# Patient Record
Sex: Female | Born: 1975 | Race: White | Hispanic: No | Marital: Married | Smoking: Never smoker
Health system: Southern US, Community
[De-identification: ages and names within clinical notes are randomized; demographics above are authoritative.]

## PROBLEM LIST (undated history)

## (undated) DIAGNOSIS — N939 Abnormal uterine and vaginal bleeding, unspecified: Secondary | ICD-10-CM

## (undated) DIAGNOSIS — E559 Vitamin D deficiency, unspecified: Secondary | ICD-10-CM

## (undated) DIAGNOSIS — Z8632 Personal history of gestational diabetes: Secondary | ICD-10-CM

## (undated) DIAGNOSIS — G43909 Migraine, unspecified, not intractable, without status migrainosus: Secondary | ICD-10-CM

## (undated) DIAGNOSIS — O24419 Gestational diabetes mellitus in pregnancy, unspecified control: Secondary | ICD-10-CM

## (undated) DIAGNOSIS — Z973 Presence of spectacles and contact lenses: Secondary | ICD-10-CM

## (undated) DIAGNOSIS — K219 Gastro-esophageal reflux disease without esophagitis: Secondary | ICD-10-CM

## (undated) DIAGNOSIS — E039 Hypothyroidism, unspecified: Secondary | ICD-10-CM

## (undated) HISTORY — PX: PILONIDAL CYST EXCISION: SHX744

## (undated) HISTORY — PX: OTHER SURGICAL HISTORY: SHX169

## (undated) HISTORY — DX: Gestational diabetes mellitus in pregnancy, unspecified control: O24.419

## (undated) HISTORY — PX: WISDOM TOOTH EXTRACTION: SHX21

---

## 1997-12-11 ENCOUNTER — Other Ambulatory Visit: Admission: RE | Admit: 1997-12-11 | Discharge: 1997-12-11 | Payer: Self-pay | Admitting: Obstetrics and Gynecology

## 2007-05-28 ENCOUNTER — Emergency Department (HOSPITAL_COMMUNITY): Admission: EM | Admit: 2007-05-28 | Discharge: 2007-05-28 | Payer: Self-pay | Admitting: Family Medicine

## 2010-09-29 ENCOUNTER — Inpatient Hospital Stay (HOSPITAL_COMMUNITY)
Admission: AD | Admit: 2010-09-29 | Discharge: 2010-09-29 | Disposition: A | Discharge status: Home / Self Care | Payer: Self-pay | Source: Ambulatory Visit | Attending: Obstetrics and Gynecology | Admitting: Obstetrics and Gynecology

## 2010-09-29 DIAGNOSIS — O209 Hemorrhage in early pregnancy, unspecified: Secondary | ICD-10-CM | POA: Insufficient documentation

## 2010-10-04 LAB — HIV ANTIBODY (ROUTINE TESTING W REFLEX): HIV: NONREACTIVE

## 2010-10-04 LAB — GC/CHLAMYDIA PROBE AMP, GENITAL
Chlamydia: NEGATIVE
Gonorrhea: NEGATIVE

## 2010-10-04 LAB — ANTIBODY SCREEN: Antibody Screen: NEGATIVE

## 2010-10-04 LAB — HEPATITIS B SURFACE ANTIGEN: Hepatitis B Surface Ag: NEGATIVE

## 2010-10-04 LAB — ABO/RH: RH Type: NEGATIVE

## 2010-10-15 ENCOUNTER — Other Ambulatory Visit (HOSPITAL_COMMUNITY): Payer: Self-pay | Admitting: Obstetrics and Gynecology

## 2010-10-15 DIAGNOSIS — O209 Hemorrhage in early pregnancy, unspecified: Secondary | ICD-10-CM

## 2010-10-20 ENCOUNTER — Ambulatory Visit (HOSPITAL_COMMUNITY): Payer: Self-pay

## 2011-01-20 ENCOUNTER — Other Ambulatory Visit (HOSPITAL_COMMUNITY): Payer: Self-pay | Admitting: Obstetrics and Gynecology

## 2011-01-20 DIAGNOSIS — Z3689 Encounter for other specified antenatal screening: Secondary | ICD-10-CM

## 2011-02-08 ENCOUNTER — Ambulatory Visit (HOSPITAL_COMMUNITY): Payer: BC Managed Care – PPO

## 2011-03-29 NOTE — L&D Delivery Note (Signed)
Delivery Note Pt progressed rapidly to complete dilation and pushed great.  At 7:13 AM a healthy female was delivered via Vaginal, Spontaneous Delivery (Presentation: Left Occiput Anterior).  APGAR: 9, 9; weight 5 lb 13 oz (2637 g).   Placenta status: Intact, Spontaneous.  Cord: 3 vessels with the following complications: None.  Anesthesia: Epidural  Episiotomy: None Lacerations: 2nd degree Suture Repair: 3.0 vicryl rapide Est. Blood Loss (mL): 450 cc  Mom to postpartum.  Baby to nursery-stable. Per pt, fainted in past with narcotics--not an allergy.  Advised if needs percocet to take only one and on a full stomach.  Kristy Larsen 04/13/2011, 7:40 AM

## 2011-04-12 ENCOUNTER — Encounter (HOSPITAL_COMMUNITY): Payer: Self-pay

## 2011-04-12 ENCOUNTER — Inpatient Hospital Stay (HOSPITAL_COMMUNITY)
Admission: AD | Admit: 2011-04-12 | Discharge: 2011-04-15 | DRG: 372 | Disposition: A | Discharge status: Home / Self Care | Payer: BC Managed Care – PPO | Source: Ambulatory Visit | Attending: Obstetrics and Gynecology | Admitting: Obstetrics and Gynecology

## 2011-04-12 HISTORY — DX: Hypothyroidism, unspecified: E03.9

## 2011-04-12 HISTORY — DX: Migraine, unspecified, not intractable, without status migrainosus: G43.909

## 2011-04-12 HISTORY — DX: Gastro-esophageal reflux disease without esophagitis: K21.9

## 2011-04-12 LAB — CBC
Hemoglobin: 12.3 g/dL (ref 12.0–15.0)
MCHC: 34.9 g/dL (ref 30.0–36.0)
RBC: 3.98 MIL/uL (ref 3.87–5.11)

## 2011-04-12 MED ORDER — PENICILLIN G POTASSIUM 5000000 UNITS IJ SOLR
2.5000 10*6.[IU] | INTRAVENOUS | Status: DC
  Administered 2011-04-13: 2.5 10*6.[IU] via INTRAVENOUS
  Filled 2011-04-12 (×4): qty 2.5

## 2011-04-12 MED ORDER — LACTATED RINGERS IV SOLN
INTRAVENOUS | Status: DC
  Administered 2011-04-12 – 2011-04-13 (×2): via INTRAVENOUS

## 2011-04-12 MED ORDER — DIPHENHYDRAMINE HCL 50 MG/ML IJ SOLN: 12.5000 mg | INTRAMUSCULAR | Status: DC | PRN

## 2011-04-12 MED ORDER — FLEET ENEMA 7-19 GM/118ML RE ENEM: 1.0000 | ENEMA | RECTAL | Status: DC | PRN

## 2011-04-12 MED ORDER — LORATADINE 10 MG PO TABS
10.0000 mg | ORAL_TABLET | Freq: Every day | ORAL | Status: DC
  Filled 2011-04-12: qty 1

## 2011-04-12 MED ORDER — LIDOCAINE HCL (PF) 1 % IJ SOLN
30.0000 mL | INTRAMUSCULAR | Status: DC | PRN
  Administered 2011-04-13: 30 mL via SUBCUTANEOUS
  Filled 2011-04-12: qty 30

## 2011-04-12 MED ORDER — PHENYLEPHRINE 40 MCG/ML (10ML) SYRINGE FOR IV PUSH (FOR BLOOD PRESSURE SUPPORT): 80.0000 ug | PREFILLED_SYRINGE | INTRAVENOUS | Status: DC | PRN

## 2011-04-12 MED ORDER — OXYTOCIN BOLUS FROM INFUSION
500.0000 mL | Freq: Once | INTRAVENOUS | Status: DC
  Filled 2011-04-12: qty 500
  Filled 2011-04-12: qty 1000

## 2011-04-12 MED ORDER — PHENYLEPHRINE 40 MCG/ML (10ML) SYRINGE FOR IV PUSH (FOR BLOOD PRESSURE SUPPORT)
80.0000 ug | PREFILLED_SYRINGE | INTRAVENOUS | Status: DC | PRN
  Filled 2011-04-12: qty 5

## 2011-04-12 MED ORDER — PENICILLIN G POTASSIUM 5000000 UNITS IJ SOLR
5.0000 10*6.[IU] | Freq: Once | INTRAVENOUS | Status: AC
  Administered 2011-04-12: 5 10*6.[IU] via INTRAVENOUS
  Filled 2011-04-12: qty 5

## 2011-04-12 MED ORDER — LACTATED RINGERS IV SOLN: 500.0000 mL | Freq: Once | INTRAVENOUS | Status: DC

## 2011-04-12 MED ORDER — ACETAMINOPHEN 325 MG PO TABS: 650.0000 mg | ORAL_TABLET | ORAL | Status: DC | PRN

## 2011-04-12 MED ORDER — LEVOTHYROXINE SODIUM 125 MCG PO TABS
125.0000 ug | ORAL_TABLET | Freq: Every day | ORAL | Status: DC
  Administered 2011-04-13: 125 ug via ORAL
  Filled 2011-04-12: qty 1

## 2011-04-12 MED ORDER — OXYTOCIN 20 UNITS IN LACTATED RINGERS INFUSION - SIMPLE
125.0000 mL/h | Freq: Once | INTRAVENOUS | Status: AC
  Administered 2011-04-13: 999 mL/h via INTRAVENOUS

## 2011-04-12 MED ORDER — LACTATED RINGERS IV SOLN: 500.0000 mL | INTRAVENOUS | Status: DC | PRN

## 2011-04-12 MED ORDER — EPHEDRINE 5 MG/ML INJ: 10.0000 mg | INTRAVENOUS | Status: DC | PRN

## 2011-04-12 MED ORDER — CITRIC ACID-SODIUM CITRATE 334-500 MG/5ML PO SOLN: 30.0000 mL | ORAL | Status: DC | PRN

## 2011-04-12 MED ORDER — EPHEDRINE 5 MG/ML INJ
10.0000 mg | INTRAVENOUS | Status: DC | PRN
  Filled 2011-04-12: qty 4

## 2011-04-12 MED ORDER — FENTANYL 2.5 MCG/ML BUPIVACAINE 1/10 % EPIDURAL INFUSION (WH - ANES)
14.0000 mL/h | INTRAMUSCULAR | Status: DC
  Administered 2011-04-13 (×2): 14 mL/h via EPIDURAL
  Filled 2011-04-12 (×2): qty 60

## 2011-04-12 MED ORDER — ONDANSETRON HCL 4 MG/2ML IJ SOLN: 4.0000 mg | Freq: Four times a day (QID) | INTRAMUSCULAR | Status: DC | PRN

## 2011-04-12 NOTE — Progress Notes (Signed)
Notified of pt presenting for labor check. Notified of VE and ctx pattern.  Notified of bloody show and bulging bag.  Admit orders received.

## 2011-04-12 NOTE — Progress Notes (Signed)
Pt may go to room 166. 

## 2011-04-12 NOTE — Progress Notes (Signed)
Pt presents to mau for labor check.  States + fetal movement.  Not as much as usual but does have movement.

## 2011-04-12 NOTE — H&P (Signed)
Kristy Larsen is a 36 y.o. female  G1P0 at 36+ weeks (EDD 05/06/11 by 5 week [place] and known date of conception with IVF pregnancy)  presenting for regular contractions and cervical change to 4+ cm.  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  Also Rh negative, receiving rhogam at 28 weeks and pt with hypothyroidism on synthroid with levels checked throughout pregnancy.  Maternal Medical History:  Reason for admission: Reason for admission: contractions.  Contractions: Onset was 6-12 hours ago.   Frequency: regular.   Perceived severity is strong.    Fetal activity: Perceived fetal activity is normal.    Prenatal complications: Bleeding.     OB History    Grav Para Term Preterm Abortions TAB SAB Ect Mult Living   1              Past Medical History  Diagnosis Date  . Acid reflux   . Migraines   . Hypothyroid    Past Surgical History  Procedure Date  . Cyst   . Wisdom tooth extraction    Family History: family history is not on file. Social History:  reports that she has never smoked. She does not have any smokeless tobacco history on file. She reports that she does not drink alcohol or use illicit drugs.  ROS  Dilation: 4 Effacement (%): 80 Station: -1 (Bulging Bag. ) Exam by:: Humphrey Rolls, RN Blood pressure 143/86, pulse 86, temperature 98.3 F (36.8 C), resp. rate 18. Maternal Exam:  Uterine Assessment: Contraction strength is firm.  Contraction frequency is regular.   Abdomen: Patient reports no abdominal tenderness. Fetal presentation: vertex  Introitus: Normal vulva. Normal vagina.    Fetal Exam Fetal State Assessment: Category I - tracings are normal.     Physical Exam  Constitutional: She is oriented to person, place, and time. She appears well-developed and well-nourished.  Cardiovascular: Normal rate and regular rhythm.   Respiratory: Effort normal and breath sounds normal.  GI: Soft.  Genitourinary:       Cervix 80/4/-2   Neurological: She is alert and oriented to person, place, and time.  Psychiatric: She has a normal mood and affect. Her behavior is normal.    Prenatal labs: ABO, Rh: A/Negative/-- (07/09 0000) Antibody: Negative (07/09 0000) Rubella: Immune (07/09 0000) RPR: Nonreactive (07/09 0000)  HBsAg: Negative (07/09 0000)  HIV: Non-reactive (07/09 0000)  GBS:   unknown--result not back One hour glucola 113 Declined genetic screens  Assessment/Plan: Pt uncomfortable in labor.  Will admit and get epidural.  Will treat with PCN for unknown GBS and preterm status.   Oliver Pila 04/12/2011, 11:42 PM

## 2011-04-13 ENCOUNTER — Encounter (HOSPITAL_COMMUNITY): Payer: Self-pay | Admitting: Anesthesiology

## 2011-04-13 ENCOUNTER — Inpatient Hospital Stay (HOSPITAL_COMMUNITY): Payer: BC Managed Care – PPO | Admitting: Anesthesiology

## 2011-04-13 ENCOUNTER — Encounter (HOSPITAL_COMMUNITY): Payer: Self-pay | Admitting: *Deleted

## 2011-04-13 LAB — RH IG WORKUP (INCLUDES ABO/RH)
ABO/RH(D): A NEG
Antibody Screen: POSITIVE
DAT, IgG: NEGATIVE
Fetal Screen: NEGATIVE
Gestational Age(Wks): 36.5
Unit division: 0

## 2011-04-13 LAB — RPR: RPR Ser Ql: NONREACTIVE

## 2011-04-13 LAB — ABO/RH: ABO/RH(D): A NEG

## 2011-04-13 MED ORDER — PRENATAL MULTIVITAMIN CH
1.0000 | ORAL_TABLET | Freq: Every day | ORAL | Status: DC
  Administered 2011-04-14: 1 via ORAL
  Filled 2011-04-13 (×3): qty 1

## 2011-04-13 MED ORDER — OXYCODONE-ACETAMINOPHEN 5-325 MG PO TABS
1.0000 | ORAL_TABLET | ORAL | Status: DC | PRN
  Administered 2011-04-13: 1 via ORAL
  Filled 2011-04-13: qty 1

## 2011-04-13 MED ORDER — ONDANSETRON HCL 4 MG/2ML IJ SOLN: 4.0000 mg | INTRAMUSCULAR | Status: DC | PRN

## 2011-04-13 MED ORDER — LIDOCAINE HCL 1.5 % IJ SOLN
INTRAMUSCULAR | Status: DC | PRN
  Administered 2011-04-13 (×2): 5 mL via EPIDURAL

## 2011-04-13 MED ORDER — DIBUCAINE 1 % RE OINT: 1.0000 "application " | TOPICAL_OINTMENT | RECTAL | Status: DC | PRN

## 2011-04-13 MED ORDER — IBUPROFEN 600 MG PO TABS
600.0000 mg | ORAL_TABLET | Freq: Four times a day (QID) | ORAL | Status: DC
  Administered 2011-04-13 – 2011-04-15 (×10): 600 mg via ORAL
  Filled 2011-04-13 (×11): qty 1

## 2011-04-13 MED ORDER — WITCH HAZEL-GLYCERIN EX PADS: 1.0000 "application " | MEDICATED_PAD | CUTANEOUS | Status: DC | PRN

## 2011-04-13 MED ORDER — TETANUS-DIPHTH-ACELL PERTUSSIS 5-2.5-18.5 LF-MCG/0.5 IM SUSP: 0.5000 mL | Freq: Once | INTRAMUSCULAR | Status: DC

## 2011-04-13 MED ORDER — DIPHENHYDRAMINE HCL 25 MG PO CAPS: 25.0000 mg | ORAL_CAPSULE | Freq: Four times a day (QID) | ORAL | Status: DC | PRN

## 2011-04-13 MED ORDER — BENZOCAINE-MENTHOL 20-0.5 % EX AERO
1.0000 "application " | INHALATION_SPRAY | CUTANEOUS | Status: DC | PRN
  Administered 2011-04-15: 1 via TOPICAL

## 2011-04-13 MED ORDER — LANOLIN HYDROUS EX OINT: TOPICAL_OINTMENT | CUTANEOUS | Status: DC | PRN

## 2011-04-13 MED ORDER — TERBUTALINE SULFATE 1 MG/ML IJ SOLN: 0.2500 mg | Freq: Once | INTRAMUSCULAR | Status: DC | PRN

## 2011-04-13 MED ORDER — SIMETHICONE 80 MG PO CHEW
80.0000 mg | CHEWABLE_TABLET | ORAL | Status: DC | PRN
  Administered 2011-04-13 – 2011-04-14 (×2): 80 mg via ORAL

## 2011-04-13 MED ORDER — BENZOCAINE-MENTHOL 20-0.5 % EX AERO
INHALATION_SPRAY | CUTANEOUS | Status: AC
  Filled 2011-04-13: qty 56

## 2011-04-13 MED ORDER — OXYTOCIN 20 UNITS IN LACTATED RINGERS INFUSION - SIMPLE
1.0000 m[IU]/min | INTRAVENOUS | Status: DC
  Administered 2011-04-13: 2 m[IU]/min via INTRAVENOUS

## 2011-04-13 MED ORDER — ZOLPIDEM TARTRATE 5 MG PO TABS: 5.0000 mg | ORAL_TABLET | Freq: Every evening | ORAL | Status: DC | PRN

## 2011-04-13 MED ORDER — ONDANSETRON HCL 4 MG PO TABS: 4.0000 mg | ORAL_TABLET | ORAL | Status: DC | PRN

## 2011-04-13 MED ORDER — SENNOSIDES-DOCUSATE SODIUM 8.6-50 MG PO TABS
2.0000 | ORAL_TABLET | Freq: Every day | ORAL | Status: DC
  Administered 2011-04-13 – 2011-04-14 (×2): 2 via ORAL

## 2011-04-13 NOTE — Progress Notes (Signed)
Pt admitted and received epidural and is comfortable.  Made good progress with SROM at 300am with cervix at 7 cm.  At 5am was still 7-8 cm.  Has received PCN x 2 doses for unknown GBS/preterm status.  Subjective: Comfortable with some pressure.  Objective: BP 107/70  Pulse 89  Temp(Src) 99.5 F (37.5 C) (Oral)  Resp 18  Ht 5\' 5"  (1.651 m)  Wt 84.823 kg (187 lb)  BMI 31.12 kg/m2  SpO2 88%      FHT:  FHR: 150 bpm, variability: moderate,  accelerations:  Present,  decelerations:  Absent UC:   regular, every 6-7 minutes SVE:  C/8/0 Labs: Lab Results  Component Value Date   WBC 16.7* 04/12/2011   HGB 12.3 04/12/2011   HCT 35.2* 04/12/2011   MCV 88.4 04/12/2011   PLT 166 04/12/2011    Assessment / Plan: Pt at 8cm, but progress has been slower over last 2 hours so IUPC placed and will augment with pitocin as needed. Oliver Pila 04/13/2011, 6:08 AM

## 2011-04-13 NOTE — Anesthesia Procedure Notes (Signed)
Epidural Patient location during procedure: OB Start time: 04/13/2011 12:23 AM  Staffing Anesthesiologist: Brayton Caves R Performed by: anesthesiologist   Preanesthetic Checklist Completed: patient identified, site marked, surgical consent, pre-op evaluation, timeout performed, IV checked, risks and benefits discussed and monitors and equipment checked  Epidural Patient position: sitting Prep: site prepped and draped and DuraPrep Patient monitoring: continuous pulse ox and blood pressure Approach: midline Injection technique: LOR air and LOR saline  Needle:  Needle type: Tuohy  Needle gauge: 17 G Needle length: 9 cm Needle insertion depth: 5 cm cm Catheter type: closed end flexible Catheter size: 19 Gauge Catheter at skin depth: 10 cm Test dose: negative  Assessment Events: blood not aspirated, injection not painful, no injection resistance, negative IV test and no paresthesia  Additional Notes Patient identified.  Risk benefits discussed including failed block, incomplete pain control, headache, nerve damage, paralysis, blood pressure changes, nausea, vomiting, reactions to medication both toxic or allergic, and postpartum back pain.  Patient expressed understanding and wished to proceed.  All questions were answered.  Sterile technique used throughout procedure and epidural site dressed with sterile barrier dressing. No paresthesia or other complications noted.The patient did not experience any signs of intravascular injection such as tinnitus or metallic taste in mouth nor signs of intrathecal spread such as rapid motor block. Please see nursing notes for vital signs.

## 2011-04-14 LAB — CBC
HCT: 27.5 % — ABNORMAL LOW (ref 36.0–46.0)
Hemoglobin: 9.2 g/dL — ABNORMAL LOW (ref 12.0–15.0)
MCH: 30.4 pg (ref 26.0–34.0)
MCHC: 33.5 g/dL (ref 30.0–36.0)

## 2011-04-14 MED ORDER — RHO D IMMUNE GLOBULIN 1500 UNIT/2ML IJ SOLN
300.0000 ug | Freq: Once | INTRAMUSCULAR | Status: AC
  Administered 2011-04-15: 300 ug via INTRAMUSCULAR
  Filled 2011-04-14: qty 2

## 2011-04-14 NOTE — Anesthesia Postprocedure Evaluation (Signed)
  Anesthesia Post-op Note  Patient: Kristy Larsen  Procedure(s) Performed: * No procedures listed *  Patient Location: PACU and Mother/Baby  Anesthesia Type: Epidural  Level of Consciousness: awake, alert  and oriented  Airway and Oxygen Therapy: Patient Spontanous Breathing  Post-op Assessment: Post-op Vital signs reviewed, Patient's Cardiovascular Status Stable, No headache, No backache, No residual numbness and No residual motor weakness  Post-op Vital Signs: Reviewed and stable  Complications: No apparent anesthesia complications

## 2011-04-14 NOTE — Anesthesia Preprocedure Evaluation (Signed)
Anesthesia Evaluation  Patient identified by MRN, date of birth, ID band Patient awake    Reviewed: Allergy & Precautions, H&P , Patient's Chart, lab work & pertinent test results  Airway Mallampati: II TM Distance: >3 FB Neck ROM: full    Dental No notable dental hx.    Pulmonary neg pulmonary ROS,  clear to auscultation  Pulmonary exam normal       Cardiovascular neg cardio ROS regular Normal    Neuro/Psych  Headaches, Negative Neurological ROS  Negative Psych ROS   GI/Hepatic negative GI ROS, Neg liver ROS, GERD-  ,  Endo/Other  Negative Endocrine ROS  Renal/GU negative Renal ROS     Musculoskeletal   Abdominal   Peds  Hematology negative hematology ROS (+)   Anesthesia Other Findings   Reproductive/Obstetrics (+) Pregnancy                           Anesthesia Physical Anesthesia Plan  ASA: II  Anesthesia Plan: Epidural   Post-op Pain Management:    Induction:   Airway Management Planned:   Additional Equipment:   Intra-op Plan:   Post-operative Plan:   Informed Consent: I have reviewed the patients History and Physical, chart, labs and discussed the procedure including the risks, benefits and alternatives for the proposed anesthesia with the patient or authorized representative who has indicated his/her understanding and acceptance.     Plan Discussed with:   Anesthesia Plan Comments:         Anesthesia Quick Evaluation

## 2011-04-14 NOTE — Progress Notes (Signed)
UR Chart review completed.  

## 2011-04-14 NOTE — Progress Notes (Signed)
Post Partum Day 1 Subjective: no complaints and tolerating PO  Objective: Blood pressure 126/82, pulse 76, temperature 98.3 F (36.8 C), temperature source Oral, resp. rate 20, height 5\' 5"  (1.651 m), weight 84.823 kg (187 lb), SpO2 88.00%, unknown if currently breastfeeding.  Physical Exam:  General: alert Lochia: appropriate Uterine Fundus: firm   Basename 04/14/11 0656 04/12/11 2325  HGB 9.2* 12.3  HCT 27.5* 35.2*    Assessment/Plan: Plan for discharge tomorrow   LOS: 2 days   Per Beagley W 04/14/2011, 9:01 AM

## 2011-04-15 MED ORDER — BENZOCAINE-MENTHOL 20-0.5 % EX AERO
INHALATION_SPRAY | CUTANEOUS | Status: AC
  Administered 2011-04-15: 1 via TOPICAL
  Filled 2011-04-15: qty 56

## 2011-04-15 MED ORDER — IBUPROFEN 600 MG PO TABS: 600.0000 mg | ORAL_TABLET | Freq: Four times a day (QID) | ORAL | Status: AC

## 2011-04-15 NOTE — Progress Notes (Signed)
Post Partum Day 2 Subjective: no complaints and tolerating PO  Objective: Blood pressure 124/88, pulse 73, temperature 98.5 F (36.9 C), temperature source Oral, resp. rate 20, height 5\' 5"  (1.651 m), weight 84.823 kg (187 lb), SpO2 88.00%, unknown if currently breastfeeding.  Physical Exam:  General: alert Lochia: appropriate Uterine Fundus: firm    Basename 04/14/11 0656 04/12/11 2325  HGB 9.2* 12.3  HCT 27.5* 35.2*    Assessment/Plan: Discharge home Motrin F/u 6 weeks   LOS: 3 days   Lauri Till W 04/15/2011, 8:34 AM

## 2011-04-15 NOTE — Discharge Summary (Signed)
Obstetric Discharge Summary Reason for Admission: onset of labor Prenatal Procedures: none Intrapartum Procedures: spontaneous vaginal delivery Postpartum Procedures: none Complications-Operative and Postpartum: second degree perineal laceration Hemoglobin  Date Value Range Status  04/14/2011 9.2* 12.0-15.0 (g/dL) Final     DELTA CHECK NOTED     REPEATED TO VERIFY     HCT  Date Value Range Status  04/14/2011 27.5* 36.0-46.0 (%) Final    Discharge Diagnoses: Preterm pregnancy at 36+weeks delivered  Discharge Information: Date: 04/15/2011 Activity: pelvic rest Diet: routine Medications: Ibuprofen Condition: improved Instructions: refer to practice specific booklet Discharge to: home   Newborn Data: Live born female  Birth Weight: 5 lb 13 oz (2637 g) APGAR: 9, 9  Home with mother.  Kristy Larsen 04/15/2011, 8:34 AM

## 2011-04-15 NOTE — Progress Notes (Signed)
Discharge instructions went over with patient. Baby to be baby patient.

## 2014-01-27 ENCOUNTER — Encounter (HOSPITAL_COMMUNITY): Payer: Self-pay | Admitting: *Deleted

## 2015-03-29 NOTE — L&D Delivery Note (Signed)
Procedure note  Patient was taken to the operating room where epidural anesthesia was administered and found to be adequate by Allis clamp test. FHTs confirmed in 140s.  She was prepped and draped in the normal sterile fashion in the dorsal supine position with a leftward tilt. An appropriate time out was performed. A Pfannenstiel skin incision was then made with the scalpel 2 finger breaths above the pubic symphysis and carried through to the underlying layer of fascia by sharp dissection and Bovie cautery. The fascia was nicked in the midline and the incision was extended laterally with Mayo scissors. The inferior aspect of the incision was grasped Coker clamps and dissected off the underlying rectus muscles. In a similar fashion the superior aspect was dissected off the rectus muscles. Rectus muscles were separated in the midline and the peritoneal cavity entered bluntly. The peritoneal incision was then extended both superiorly and inferiorly with careful attention to avoid both bowel and bladder. The Alexis self-retaining wound retractor was then placed within the incision and the lower uterine segment exposed. The bladder flap was developed with Metzenbaum scissors and pushed away from the lower uterine segment. The lower uterine segment was then incised in a transverse fashion and the cavity itself entered bluntly. The incision was extended bluntly. An anterior placenta was encountered; advancing past this, the infant's head was palpated and several loops of umbilical cord surrounding infants head were pushed out of the way gently thus infants head was then lifted and delivered from the incision without difficulty. The remainder of the infant delivered easily and the nose and mouth bulb suctioned with the cord clamped and cut after 1 minute delay. The infant was handed off to the waiting pediatricians. The placenta was then spontaneously expressed from the uterus and the uterus cleared of all clots and  debris with moist lap sponge. The uterine incision was then repaired in 1 l running locked layer using 0 chromic - a second layer was not indicated. The tubes and ovaries were inspected and the gutters cleared of all clots and debris. The uterine incision was inspected again and found to be hemostatic. All instruments and sponges as well as the Alexis retractor were then removed from the abdomen. The peritoneum was then reapproximated in a purse string fashion and the rectus muscle in a loose figure 8 with 2-0 Vicryl. The fascia was then closed with 0 Vicryl in a running fashion. Subcutaneous tissue was reapproximated with 2-0 plain in a running fashion. The skin was closed with a subcuticular stitch of 4-0 Vicryl on a Keith needle and then reinforced with a honeycomb and pressure dressing. At the conclusion of the procedure all instruments and sponge counts were correct. Patient was taken to the recovery room in good condition with her baby accompanying her skin to skin.

## 2015-05-20 ENCOUNTER — Ambulatory Visit: Payer: Managed Care, Other (non HMO)

## 2015-05-27 ENCOUNTER — Encounter: Payer: Managed Care, Other (non HMO) | Attending: Obstetrics and Gynecology

## 2015-05-27 VITALS — Ht 60.0 in | Wt 167.8 lb

## 2015-05-27 DIAGNOSIS — O24419 Gestational diabetes mellitus in pregnancy, unspecified control: Secondary | ICD-10-CM

## 2015-05-27 DIAGNOSIS — O24410 Gestational diabetes mellitus in pregnancy, diet controlled: Secondary | ICD-10-CM | POA: Diagnosis present

## 2015-06-02 NOTE — Progress Notes (Signed)
  Patient was seen on 05/27/15 for Gestational Diabetes self-management . The following learning objectives were met by the patient :   States the definition of Gestational Diabetes  States why dietary management is important in controlling blood glucose  Describes the effects of carbohydrates on blood glucose levels  Demonstrates ability to create a balanced meal plan  Demonstrates carbohydrate counting   States when to check blood glucose levels  Demonstrates proper blood glucose monitoring techniques  States the effect of stress and exercise on blood glucose levels  States the importance of limiting caffeine and abstaining from alcohol and smoking  Plan:  Aim for 2 Carb Choices per meal (30 grams) +/- 1 either way for breakfast Aim for 3 Carb Choices per meal (45 grams) +/- 1 either way from lunch and dinner Aim for 1-2 Carbs per snack Begin reading food labels for Total Carbohydrate and sugar grams of foods Consider  increasing your activity level by walking daily as tolerated Begin checking BG before breakfast and 2 hours after first bit of breakfast, lunch and dinner after  as directed by MD  Take medication  as directed by MD  Patient instructed to monitor glucose levels: FBS: 60 - <90 2 hour: <120  Patient received the following handouts:  Nutrition Diabetes and Pregnancy  Carbohydrate Counting List  Meal Planning worksheet  Patient will be seen for follow-up as needed.

## 2015-06-09 ENCOUNTER — Telehealth (HOSPITAL_COMMUNITY): Payer: Self-pay | Admitting: Lactation Services

## 2015-06-09 NOTE — Telephone Encounter (Signed)
Mom called with questions- to deliver in a couple of [place]. Used NS with first baby and may need to use again. Told her we have them available and will assist with latch while she is here. Taking Claritin for allergies- is this OK with breast feeding. L1 per Bobbye Mortonhomas Hale- encouraged to take only as needed. May have to have C/S- concerned about nursing after C/S- reviewed skin to skin after delivery and assist with latch. No further questions at present.

## 2015-06-29 DIAGNOSIS — Z3A35 35 weeks gestation of pregnancy: Secondary | ICD-10-CM | POA: Diagnosis not present

## 2015-06-29 DIAGNOSIS — O09213 Supervision of pregnancy with history of pre-term labor, third trimester: Secondary | ICD-10-CM | POA: Diagnosis not present

## 2015-06-29 DIAGNOSIS — O09523 Supervision of elderly multigravida, third trimester: Secondary | ICD-10-CM | POA: Diagnosis not present

## 2015-06-29 DIAGNOSIS — Z36 Encounter for antenatal screening of mother: Secondary | ICD-10-CM | POA: Diagnosis not present

## 2015-06-29 DIAGNOSIS — O24410 Gestational diabetes mellitus in pregnancy, diet controlled: Secondary | ICD-10-CM | POA: Diagnosis not present

## 2015-07-01 DIAGNOSIS — O09523 Supervision of elderly multigravida, third trimester: Secondary | ICD-10-CM | POA: Diagnosis not present

## 2015-07-01 DIAGNOSIS — O24410 Gestational diabetes mellitus in pregnancy, diet controlled: Secondary | ICD-10-CM | POA: Diagnosis not present

## 2015-07-01 DIAGNOSIS — R102 Pelvic and perineal pain: Secondary | ICD-10-CM | POA: Diagnosis not present

## 2015-07-01 DIAGNOSIS — Z3A35 35 weeks gestation of pregnancy: Secondary | ICD-10-CM | POA: Diagnosis not present

## 2015-07-01 DIAGNOSIS — O09213 Supervision of pregnancy with history of pre-term labor, third trimester: Secondary | ICD-10-CM | POA: Diagnosis not present

## 2015-07-03 ENCOUNTER — Encounter (HOSPITAL_COMMUNITY): Payer: Self-pay | Admitting: *Deleted

## 2015-07-03 ENCOUNTER — Inpatient Hospital Stay (HOSPITAL_COMMUNITY)
Admission: AD | Admit: 2015-07-03 | Discharge: 2015-07-07 | DRG: 765 | Disposition: A | Discharge status: Home / Self Care | Payer: Managed Care, Other (non HMO) | Source: Ambulatory Visit | Attending: Obstetrics and Gynecology | Admitting: Obstetrics and Gynecology

## 2015-07-03 ENCOUNTER — Inpatient Hospital Stay (HOSPITAL_COMMUNITY): Payer: Managed Care, Other (non HMO)

## 2015-07-03 DIAGNOSIS — Z3A36 36 weeks gestation of pregnancy: Secondary | ICD-10-CM | POA: Diagnosis not present

## 2015-07-03 DIAGNOSIS — O9962 Diseases of the digestive system complicating childbirth: Secondary | ICD-10-CM | POA: Diagnosis present

## 2015-07-03 DIAGNOSIS — O99284 Endocrine, nutritional and metabolic diseases complicating childbirth: Secondary | ICD-10-CM | POA: Diagnosis present

## 2015-07-03 DIAGNOSIS — O09523 Supervision of elderly multigravida, third trimester: Secondary | ICD-10-CM | POA: Diagnosis not present

## 2015-07-03 DIAGNOSIS — K219 Gastro-esophageal reflux disease without esophagitis: Secondary | ICD-10-CM | POA: Diagnosis present

## 2015-07-03 DIAGNOSIS — Z6791 Unspecified blood type, Rh negative: Secondary | ICD-10-CM

## 2015-07-03 DIAGNOSIS — O26893 Other specified pregnancy related conditions, third trimester: Secondary | ICD-10-CM | POA: Diagnosis present

## 2015-07-03 DIAGNOSIS — O99824 Streptococcus B carrier state complicating childbirth: Secondary | ICD-10-CM | POA: Diagnosis present

## 2015-07-03 DIAGNOSIS — Z36 Encounter for antenatal screening of mother: Secondary | ICD-10-CM | POA: Diagnosis not present

## 2015-07-03 DIAGNOSIS — E039 Hypothyroidism, unspecified: Secondary | ICD-10-CM | POA: Diagnosis present

## 2015-07-03 DIAGNOSIS — O24420 Gestational diabetes mellitus in childbirth, diet controlled: Secondary | ICD-10-CM | POA: Diagnosis present

## 2015-07-03 DIAGNOSIS — Z3689 Encounter for other specified antenatal screening: Secondary | ICD-10-CM

## 2015-07-03 HISTORY — DX: Gestational diabetes mellitus in pregnancy, unspecified control: O24.419

## 2015-07-03 LAB — URINE MICROSCOPIC-ADD ON

## 2015-07-03 LAB — URINALYSIS, ROUTINE W REFLEX MICROSCOPIC
BILIRUBIN URINE: NEGATIVE
Glucose, UA: NEGATIVE mg/dL
KETONES UR: NEGATIVE mg/dL
Leukocytes, UA: NEGATIVE
NITRITE: NEGATIVE
PH: 5.5 (ref 5.0–8.0)
Protein, ur: NEGATIVE mg/dL

## 2015-07-03 NOTE — MAU Note (Signed)
Contractions on and off all wk. More ctxs today. Some bloody show tonight. 1cm and 50%on Weds. First baby at 6136.5days

## 2015-07-04 ENCOUNTER — Encounter (HOSPITAL_COMMUNITY)
Admission: AD | Disposition: A | Discharge status: Home / Self Care | Payer: Self-pay | Source: Ambulatory Visit | Attending: Obstetrics and Gynecology

## 2015-07-04 ENCOUNTER — Inpatient Hospital Stay (HOSPITAL_COMMUNITY): Payer: Managed Care, Other (non HMO) | Admitting: Anesthesiology

## 2015-07-04 DIAGNOSIS — O99824 Streptococcus B carrier state complicating childbirth: Secondary | ICD-10-CM | POA: Diagnosis not present

## 2015-07-04 DIAGNOSIS — O24420 Gestational diabetes mellitus in childbirth, diet controlled: Secondary | ICD-10-CM | POA: Diagnosis not present

## 2015-07-04 DIAGNOSIS — O99284 Endocrine, nutritional and metabolic diseases complicating childbirth: Secondary | ICD-10-CM | POA: Diagnosis not present

## 2015-07-04 DIAGNOSIS — E039 Hypothyroidism, unspecified: Secondary | ICD-10-CM | POA: Diagnosis not present

## 2015-07-04 DIAGNOSIS — Z3A36 36 weeks gestation of pregnancy: Secondary | ICD-10-CM | POA: Diagnosis not present

## 2015-07-04 DIAGNOSIS — O26893 Other specified pregnancy related conditions, third trimester: Secondary | ICD-10-CM | POA: Diagnosis not present

## 2015-07-04 DIAGNOSIS — O9962 Diseases of the digestive system complicating childbirth: Secondary | ICD-10-CM | POA: Diagnosis not present

## 2015-07-04 DIAGNOSIS — Z6791 Unspecified blood type, Rh negative: Secondary | ICD-10-CM | POA: Diagnosis not present

## 2015-07-04 DIAGNOSIS — K219 Gastro-esophageal reflux disease without esophagitis: Secondary | ICD-10-CM | POA: Diagnosis not present

## 2015-07-04 LAB — CBC
HCT: 31.5 % — ABNORMAL LOW (ref 36.0–46.0)
HCT: 38.6 % (ref 36.0–46.0)
HEMOGLOBIN: 13.3 g/dL (ref 12.0–15.0)
Hemoglobin: 10.8 g/dL — ABNORMAL LOW (ref 12.0–15.0)
MCH: 30.6 pg (ref 26.0–34.0)
MCH: 30.8 pg (ref 26.0–34.0)
MCHC: 34.3 g/dL (ref 30.0–36.0)
MCHC: 34.5 g/dL (ref 30.0–36.0)
MCV: 88.9 fL (ref 78.0–100.0)
MCV: 89.7 fL (ref 78.0–100.0)
PLATELETS: 122 10*3/uL — AB (ref 150–400)
PLATELETS: 125 10*3/uL — AB (ref 150–400)
RBC: 3.51 MIL/uL — AB (ref 3.87–5.11)
RBC: 4.34 MIL/uL (ref 3.87–5.11)
RDW: 12.8 % (ref 11.5–15.5)
RDW: 13.1 % (ref 11.5–15.5)
WBC: 12.1 10*3/uL — ABNORMAL HIGH (ref 4.0–10.5)
WBC: 13.6 10*3/uL — ABNORMAL HIGH (ref 4.0–10.5)

## 2015-07-04 LAB — RPR: RPR: NONREACTIVE

## 2015-07-04 LAB — GLUCOSE, CAPILLARY: Glucose-Capillary: 92 mg/dL (ref 65–99)

## 2015-07-04 SURGERY — Surgical Case
Anesthesia: Spinal | Site: Abdomen

## 2015-07-04 MED ORDER — MEPERIDINE HCL 25 MG/ML IJ SOLN: 6.2500 mg | INTRAMUSCULAR | Status: DC | PRN

## 2015-07-04 MED ORDER — SODIUM CHLORIDE 0.9 % IR SOLN
Status: DC | PRN
Start: 2015-07-04 — End: 2015-07-04
  Administered 2015-07-04: 1000 mL

## 2015-07-04 MED ORDER — NALBUPHINE HCL 10 MG/ML IJ SOLN: 5.0000 mg | INTRAMUSCULAR | Status: DC | PRN

## 2015-07-04 MED ORDER — HYDROMORPHONE HCL 1 MG/ML IJ SOLN
INTRAMUSCULAR | Status: AC
  Filled 2015-07-04: qty 1

## 2015-07-04 MED ORDER — NALBUPHINE HCL 10 MG/ML IJ SOLN
5.0000 mg | Freq: Four times a day (QID) | INTRAMUSCULAR | Status: DC | PRN
  Administered 2015-07-04: 5 mg via SUBCUTANEOUS
  Filled 2015-07-04: qty 1

## 2015-07-04 MED ORDER — CITRIC ACID-SODIUM CITRATE 334-500 MG/5ML PO SOLN
ORAL | Status: AC
  Filled 2015-07-04: qty 15

## 2015-07-04 MED ORDER — MENTHOL 3 MG MT LOZG: 1.0000 | LOZENGE | OROMUCOSAL | Status: DC | PRN

## 2015-07-04 MED ORDER — ONDANSETRON HCL 4 MG/2ML IJ SOLN
INTRAMUSCULAR | Status: DC | PRN
  Administered 2015-07-04: 4 mg via INTRAVENOUS

## 2015-07-04 MED ORDER — TETANUS-DIPHTH-ACELL PERTUSSIS 5-2.5-18.5 LF-MCG/0.5 IM SUSP: 0.5000 mL | Freq: Once | INTRAMUSCULAR | Status: DC

## 2015-07-04 MED ORDER — LACTATED RINGERS IV SOLN: INTRAVENOUS | Status: DC

## 2015-07-04 MED ORDER — LACTATED RINGERS IV BOLUS (SEPSIS)
1000.0000 mL | Freq: Once | INTRAVENOUS | Status: AC
  Administered 2015-07-04: 1000 mL via INTRAVENOUS

## 2015-07-04 MED ORDER — KETOROLAC TROMETHAMINE 30 MG/ML IJ SOLN
30.0000 mg | Freq: Four times a day (QID) | INTRAMUSCULAR | Status: DC | PRN
Start: 2015-07-04 — End: 2015-07-04

## 2015-07-04 MED ORDER — CEFAZOLIN SODIUM-DEXTROSE 2-4 GM/100ML-% IV SOLN
INTRAVENOUS | Status: AC
  Filled 2015-07-04: qty 100

## 2015-07-04 MED ORDER — NALBUPHINE HCL 10 MG/ML IJ SOLN: 5.0000 mg | Freq: Once | INTRAMUSCULAR | Status: DC | PRN

## 2015-07-04 MED ORDER — OXYTOCIN 10 UNIT/ML IJ SOLN
40.0000 [IU] | INTRAVENOUS | Status: DC | PRN
  Administered 2015-07-04: 40 [IU] via INTRAVENOUS

## 2015-07-04 MED ORDER — FENTANYL CITRATE (PF) 100 MCG/2ML IJ SOLN
INTRAMUSCULAR | Status: DC | PRN
  Administered 2015-07-04 (×2): 50 ug via INTRAVENOUS

## 2015-07-04 MED ORDER — OXYTOCIN 10 UNIT/ML IJ SOLN
INTRAMUSCULAR | Status: AC
  Filled 2015-07-04: qty 4

## 2015-07-04 MED ORDER — BUPIVACAINE IN DEXTROSE 0.75-8.25 % IT SOLN
INTRATHECAL | Status: DC | PRN
  Administered 2015-07-04: 1.8 mL via INTRATHECAL

## 2015-07-04 MED ORDER — ONDANSETRON HCL 4 MG/2ML IJ SOLN
INTRAMUSCULAR | Status: AC
  Filled 2015-07-04: qty 2

## 2015-07-04 MED ORDER — ZOLPIDEM TARTRATE 5 MG PO TABS: 5.0000 mg | ORAL_TABLET | Freq: Every evening | ORAL | Status: DC | PRN

## 2015-07-04 MED ORDER — OXYTOCIN 10 UNIT/ML IJ SOLN: 2.5000 [IU]/h | INTRAVENOUS | Status: AC

## 2015-07-04 MED ORDER — PHENYLEPHRINE 8 MG IN D5W 100 ML (0.08MG/ML) PREMIX OPTIME
INJECTION | INTRAVENOUS | Status: DC | PRN
  Administered 2015-07-04: 60 ug/min via INTRAVENOUS

## 2015-07-04 MED ORDER — IBUPROFEN 600 MG PO TABS
600.0000 mg | ORAL_TABLET | Freq: Four times a day (QID) | ORAL | Status: DC
  Administered 2015-07-04 – 2015-07-07 (×12): 600 mg via ORAL
  Filled 2015-07-04 (×12): qty 1

## 2015-07-04 MED ORDER — MORPHINE SULFATE (PF) 0.5 MG/ML IJ SOLN
INTRAMUSCULAR | Status: AC
  Filled 2015-07-04: qty 10

## 2015-07-04 MED ORDER — ACETAMINOPHEN 325 MG PO TABS
650.0000 mg | ORAL_TABLET | ORAL | Status: DC | PRN
  Administered 2015-07-04: 650 mg via ORAL
  Filled 2015-07-04: qty 2

## 2015-07-04 MED ORDER — KETOROLAC TROMETHAMINE 30 MG/ML IJ SOLN: 30.0000 mg | Freq: Four times a day (QID) | INTRAMUSCULAR | Status: DC | PRN

## 2015-07-04 MED ORDER — NALOXONE HCL 0.4 MG/ML IJ SOLN: 0.4000 mg | INTRAMUSCULAR | Status: DC | PRN

## 2015-07-04 MED ORDER — LACTATED RINGERS IV SOLN
INTRAVENOUS | Status: DC
  Administered 2015-07-04 (×2): via INTRAVENOUS

## 2015-07-04 MED ORDER — SIMETHICONE 80 MG PO CHEW
80.0000 mg | CHEWABLE_TABLET | Freq: Three times a day (TID) | ORAL | Status: DC
  Administered 2015-07-04 – 2015-07-07 (×11): 80 mg via ORAL
  Filled 2015-07-04 (×10): qty 1

## 2015-07-04 MED ORDER — ONDANSETRON HCL 4 MG/2ML IJ SOLN: 4.0000 mg | Freq: Three times a day (TID) | INTRAMUSCULAR | Status: DC | PRN

## 2015-07-04 MED ORDER — SODIUM CHLORIDE 0.9% FLUSH: 3.0000 mL | INTRAVENOUS | Status: DC | PRN

## 2015-07-04 MED ORDER — DIPHENHYDRAMINE HCL 25 MG PO CAPS
25.0000 mg | ORAL_CAPSULE | ORAL | Status: DC | PRN
  Filled 2015-07-04: qty 1

## 2015-07-04 MED ORDER — DIPHENHYDRAMINE HCL 25 MG PO CAPS
25.0000 mg | ORAL_CAPSULE | Freq: Four times a day (QID) | ORAL | Status: DC | PRN
Start: 2015-07-04 — End: 2015-07-07

## 2015-07-04 MED ORDER — PHENYLEPHRINE 40 MCG/ML (10ML) SYRINGE FOR IV PUSH (FOR BLOOD PRESSURE SUPPORT)
PREFILLED_SYRINGE | INTRAVENOUS | Status: AC
  Filled 2015-07-04: qty 20

## 2015-07-04 MED ORDER — FENTANYL CITRATE (PF) 100 MCG/2ML IJ SOLN
INTRAMUSCULAR | Status: AC
  Filled 2015-07-04: qty 2

## 2015-07-04 MED ORDER — WITCH HAZEL-GLYCERIN EX PADS
1.0000 | MEDICATED_PAD | CUTANEOUS | Status: DC | PRN
Start: 2015-07-04 — End: 2015-07-07

## 2015-07-04 MED ORDER — LEVOTHYROXINE SODIUM 125 MCG PO TABS
125.0000 ug | ORAL_TABLET | Freq: Every day | ORAL | Status: DC
  Administered 2015-07-04 – 2015-07-07 (×4): 125 ug via ORAL
  Filled 2015-07-04 (×5): qty 1

## 2015-07-04 MED ORDER — SENNOSIDES-DOCUSATE SODIUM 8.6-50 MG PO TABS
2.0000 | ORAL_TABLET | ORAL | Status: DC
  Administered 2015-07-05 – 2015-07-07 (×3): 2 via ORAL
  Filled 2015-07-04 (×2): qty 2

## 2015-07-04 MED ORDER — CITRIC ACID-SODIUM CITRATE 334-500 MG/5ML PO SOLN
30.0000 mL | Freq: Once | ORAL | Status: AC
  Administered 2015-07-04: 30 mL via ORAL

## 2015-07-04 MED ORDER — SCOPOLAMINE 1 MG/3DAYS TD PT72: 1.0000 | MEDICATED_PATCH | Freq: Once | TRANSDERMAL | Status: DC

## 2015-07-04 MED ORDER — PRENATAL MULTIVITAMIN CH
1.0000 | ORAL_TABLET | Freq: Every day | ORAL | Status: DC
  Administered 2015-07-04 – 2015-07-07 (×4): 1 via ORAL
  Filled 2015-07-04 (×4): qty 1

## 2015-07-04 MED ORDER — OXYCODONE HCL 5 MG PO TABS
5.0000 mg | ORAL_TABLET | ORAL | Status: DC | PRN
  Administered 2015-07-05 – 2015-07-06 (×3): 5 mg via ORAL
  Filled 2015-07-04 (×3): qty 1

## 2015-07-04 MED ORDER — PHENYLEPHRINE 8 MG IN D5W 100 ML (0.08MG/ML) PREMIX OPTIME
INJECTION | INTRAVENOUS | Status: AC
  Filled 2015-07-04: qty 100

## 2015-07-04 MED ORDER — MORPHINE SULFATE (PF) 0.5 MG/ML IJ SOLN
INTRAMUSCULAR | Status: DC | PRN
  Administered 2015-07-04: 4.8 mg via INTRAVENOUS
  Administered 2015-07-04: .2 mg via EPIDURAL

## 2015-07-04 MED ORDER — DIBUCAINE 1 % RE OINT
1.0000 | TOPICAL_OINTMENT | RECTAL | Status: DC | PRN
Start: 2015-07-04 — End: 2015-07-07

## 2015-07-04 MED ORDER — DIPHENHYDRAMINE HCL 50 MG/ML IJ SOLN
12.5000 mg | INTRAMUSCULAR | Status: DC | PRN
Start: 2015-07-04 — End: 2015-07-04

## 2015-07-04 MED ORDER — DEXTROSE 5 % IV SOLN
1.0000 ug/kg/h | INTRAVENOUS | Status: DC | PRN
  Filled 2015-07-04: qty 2

## 2015-07-04 MED ORDER — OXYCODONE HCL 5 MG PO TABS
10.0000 mg | ORAL_TABLET | ORAL | Status: DC | PRN
  Administered 2015-07-04 – 2015-07-06 (×3): 10 mg via ORAL
  Filled 2015-07-04 (×3): qty 2

## 2015-07-04 MED ORDER — SCOPOLAMINE 1 MG/3DAYS TD PT72
MEDICATED_PATCH | TRANSDERMAL | Status: AC
  Filled 2015-07-04: qty 1

## 2015-07-04 MED ORDER — LANOLIN HYDROUS EX OINT: 1.0000 "application " | TOPICAL_OINTMENT | CUTANEOUS | Status: DC | PRN

## 2015-07-04 MED ORDER — HYDROMORPHONE HCL 1 MG/ML IJ SOLN
0.2500 mg | INTRAMUSCULAR | Status: DC | PRN
  Administered 2015-07-04 (×2): 0.25 mg via INTRAVENOUS

## 2015-07-04 SURGICAL SUPPLY — 39 items
APL SKNCLS STERI-STRIP NONHPOA (GAUZE/BANDAGES/DRESSINGS)
BENZOIN TINCTURE PRP APPL 2/3 (GAUZE/BANDAGES/DRESSINGS) IMPLANT
CHLORAPREP W/TINT 26ML (MISCELLANEOUS) ×3 IMPLANT
CLAMP CORD UMBIL (MISCELLANEOUS) IMPLANT
CLOSURE WOUND 1/2 X4 (GAUZE/BANDAGES/DRESSINGS)
CLOTH BEACON ORANGE TIMEOUT ST (SAFETY) ×3 IMPLANT
DRAPE C SECTION CLR SCREEN (DRAPES) ×5 IMPLANT
DRSG OPSITE POSTOP 4X10 (GAUZE/BANDAGES/DRESSINGS) ×3 IMPLANT
ELECT REM PT RETURN 9FT ADLT (ELECTROSURGICAL) ×3
ELECTRODE REM PT RTRN 9FT ADLT (ELECTROSURGICAL) ×1 IMPLANT
EXTRACTOR VACUUM KIWI (MISCELLANEOUS) IMPLANT
GLOVE BIO SURGEON STRL SZ 6.5 (GLOVE) ×2 IMPLANT
GLOVE BIO SURGEONS STRL SZ 6.5 (GLOVE) ×1
GLOVE BIOGEL PI IND STRL 7.0 (GLOVE) ×1 IMPLANT
GLOVE BIOGEL PI INDICATOR 7.0 (GLOVE) ×8
GOWN STRL REUS W/TWL LRG LVL3 (GOWN DISPOSABLE) ×6 IMPLANT
KIT ABG SYR 3ML LUER SLIP (SYRINGE) IMPLANT
NDL HYPO 25X5/8 SAFETYGLIDE (NEEDLE) IMPLANT
NEEDLE HYPO 25X5/8 SAFETYGLIDE (NEEDLE) IMPLANT
NS IRRIG 1000ML POUR BTL (IV SOLUTION) ×3 IMPLANT
PACK C SECTION WH (CUSTOM PROCEDURE TRAY) ×3 IMPLANT
PAD ABD 7.5X8 STRL (GAUZE/BANDAGES/DRESSINGS) ×2 IMPLANT
PAD OB MATERNITY 4.3X12.25 (PERSONAL CARE ITEMS) ×3 IMPLANT
PENCIL SMOKE EVAC W/HOLSTER (ELECTROSURGICAL) ×3 IMPLANT
RTRCTR C-SECT PINK 25CM LRG (MISCELLANEOUS) ×2 IMPLANT
SPONGE GAUZE 4X4 12PLY STER LF (GAUZE/BANDAGES/DRESSINGS) ×2 IMPLANT
STRIP CLOSURE SKIN 1/2X4 (GAUZE/BANDAGES/DRESSINGS) IMPLANT
SUT CHROMIC 1 CTX 36 (SUTURE) ×6 IMPLANT
SUT PLAIN 0 NONE (SUTURE) IMPLANT
SUT PLAIN 2 0 XLH (SUTURE) ×3 IMPLANT
SUT VIC AB 0 CT1 27 (SUTURE) ×6
SUT VIC AB 0 CT1 27XBRD ANBCTR (SUTURE) ×2 IMPLANT
SUT VIC AB 2-0 CT1 27 (SUTURE) ×3
SUT VIC AB 2-0 CT1 TAPERPNT 27 (SUTURE) ×1 IMPLANT
SUT VIC AB 3-0 CT1 27 (SUTURE)
SUT VIC AB 3-0 CT1 TAPERPNT 27 (SUTURE) IMPLANT
SUT VIC AB 4-0 KS 27 (SUTURE) ×3 IMPLANT
TOWEL OR 17X24 6PK STRL BLUE (TOWEL DISPOSABLE) ×5 IMPLANT
TRAY FOLEY CATH SILVER 14FR (SET/KITS/TRAYS/PACK) ×3 IMPLANT

## 2015-07-04 NOTE — Anesthesia Postprocedure Evaluation (Signed)
Anesthesia Post Note  Patient: Kristy Larsen  Procedure(s) Performed: Procedure(s) (LRB): CESAREAN SECTION (N/A)  Patient location during evaluation: PACU Anesthesia Type: Spinal Level of consciousness: awake and alert Pain management: pain level controlled Vital Signs Assessment: post-procedure vital signs reviewed and stable Respiratory status: spontaneous breathing and respiratory function stable Cardiovascular status: blood pressure returned to baseline and stable Postop Assessment: spinal receding Anesthetic complications: no    Last Vitals:  Filed Vitals:   07/04/15 0345 07/04/15 0400  BP: 105/91 113/83  Pulse: 76 76  Temp: 36.6 C   Resp: 13 16    Last Pain:  Filed Vitals:   07/04/15 0403  PainSc: 4                  Lewie LoronJohn Mallorey Odonell

## 2015-07-04 NOTE — H&P (Addendum)
Kristy Larsen is a 40 y.o. female presenting for contractions. Pt reports contractions have been intermittent for past two - three days but got stronger last night. She also begun noting low back pain which was different. No LOF. On arrival at MAU pt noted to have dilated from 1cm two days ago to 2-3 and is now 3-4cm. Due to questionable presenting part, a bedside us was performed which noted a large coil of umbilical cord presenting ahead of vertex. Concern for cord prolapse on SROM shared with patient and partner and strong recommendation made to proceed with primary cesarean section with r/b reviewed. Consent obtained. Of note pt is GDMA1 and is also on synthroid for hypothyroidism. Pregnancy was conceived via ivf due to female factor hence accurate dating. A previously noted previa was shown to have resolved with us done at [redacted]weeks ga.   Maternal Medical History:  Reason for admission: Contractions.  Nausea.  Contractions: Onset was 2 days ago.   Frequency: regular.   Duration is approximately 3 minutes.   Perceived severity is moderate.    Fetal activity: Perceived fetal activity is normal.   Last perceived fetal movement was within the past hour.    Prenatal complications: Preterm labor.   Prenatal Complications - Diabetes: gestational. Diabetes is managed by diet.      OB History    Gravida Para Term Preterm AB TAB SAB Ectopic Multiple Living   2 1  1      1      Past Medical History  Diagnosis Date  . Acid reflux   . Migraines   . Hypothyroid   . Gestational diabetes mellitus (GDM), antepartum   . Gestational diabetes    Past Surgical History  Procedure Laterality Date  . Cyst    . Wisdom tooth extraction     Family History: family history is not on file. Social History:  reports that she has never smoked. She does not have any smokeless tobacco history on file. She reports that she does not drink alcohol or use illicit drugs.   Prenatal Transfer Tool  Maternal  Diabetes: Yes:  Diabetes Type:  Diet controlled Genetic Screening: Declined Maternal Ultrasounds/Referrals: Normal- previa resolved 05/2015 Fetal Ultrasounds or other Referrals:  None Maternal Substance Abuse:  No Significant Maternal Medications:  Meds include: Other: makena injections Significant Maternal Lab Results:  Lab values include: Group B Strep positive, Rh negative ( received rhogam 05/08/15) Other Comments:  None  Review of Systems  Constitutional: Negative for fever, chills and malaise/fatigue.  Eyes: Negative for blurred vision and double vision.  Respiratory: Negative for shortness of breath.   Cardiovascular: Negative for chest pain.  Gastrointestinal: Positive for abdominal pain. Negative for heartburn, nausea and vomiting.  Genitourinary: Negative for dysuria.  Musculoskeletal: Positive for back pain.  Skin: Negative for itching and rash.  Neurological: Negative for dizziness, tingling and headaches.  Endo/Heme/Allergies: Does not bruise/bleed easily.  Psychiatric/Behavioral: Negative for depression. The patient is nervous/anxious.     Dilation: 3 (2.5-3) Effacement (%): 60 Station: Ballotable Exam by:: Avery DennisonBenji Stanley RN Blood pressure 127/80, pulse 89, temperature 98.1 F (36.7 C), resp. rate 18, height 5' (1.524 m), weight 172 lb 6.4 oz (78.2 kg), unknown if currently breastfeeding. Maternal Exam:  Uterine Assessment: Contraction strength is moderate.  Contraction duration is 3 minutes. Contraction frequency is regular.  [place] confirmed vertex however presenting ahead of vertex is a clump of umbilical cord  Abdomen: Patient reports generalized tenderness.  Estimated fetal weight is AGA.  Fetal presentation: vertex  Introitus: Normal vulva. Vagina is positive for vaginal discharge.  Cervix: Cervix evaluated by digital exam.     Physical Exam  Constitutional: She is oriented to person, place, and time. She appears well-developed and well-nourished.  HENT:   Head: Normocephalic.  Neck: Normal range of motion.  Cardiovascular: Normal rate.   Respiratory: Effort normal.  GI: Soft. There is generalized tenderness. There is no guarding.  Genitourinary: Uterus normal. Vaginal discharge found.  Small bloody show noted  Musculoskeletal: Normal range of motion. She exhibits no edema.  Neurological: She is alert and oriented to person, place, and time.  Skin: Skin is warm.  Psychiatric: She has a normal mood and affect. Her behavior is normal. Judgment and thought content normal.    Prenatal labs: ABO, Rh:   Antibody:   Rubella:   RPR:    HBsAg:    HIV:    GBS:     Assessment/Plan: G2P0101 at 83 1/[redacted]wks gestational age in preterm labor with presenting cord ahead of vertex - intact membranes Will plan to deliver via cesarean delivery Consent obtained after r/b/ reviewed - specifically risk of injury to bladder, bowels and other pelvic organs, bleeding and infection All questions answered   Sharol Given Banga 07/04/2015, 12:52 AM

## 2015-07-04 NOTE — Op Note (Signed)
Procedure note  Patient was taken to the operating room where epidural anesthesia was administered and found to be adequate by Allis clamp test. FHTs confirmed in 140s.  She was prepped and draped in the normal sterile fashion in the dorsal supine position with a leftward tilt. An appropriate time out was performed. A Pfannenstiel skin incision was then made with the scalpel 2 finger breaths above the pubic symphysis and carried through to the underlying layer of fascia by sharp dissection and Bovie cautery. The fascia was nicked in the midline and the incision was extended laterally with Mayo scissors. The inferior aspect of the incision was grasped Coker clamps and dissected off the underlying rectus muscles. In a similar fashion the superior aspect was dissected off the rectus muscles. Rectus muscles were separated in the midline and the peritoneal cavity entered bluntly. The peritoneal incision was then extended both superiorly and inferiorly with careful attention to avoid both bowel and bladder. The Alexis self-retaining wound retractor was then placed within the incision and the lower uterine segment exposed. The bladder flap was developed with Metzenbaum scissors and pushed away from the lower uterine segment. The lower uterine segment was then incised in a transverse fashion and the cavity itself entered bluntly. The incision was extended bluntly. An anterior placenta was encountered; advancing past this, the infant's head was palpated and several loops of umbilical cord surrounding infants head were pushed out of the way gently thus infants head was then lifted and delivered from the incision without difficulty. The remainder of the infant delivered easily and the nose and mouth bulb suctioned with the cord clamped and cut after 1 minute delay. The infant was handed off to the waiting pediatricians. The placenta was then spontaneously expressed from the uterus and the uterus cleared of all clots and  debris with moist lap sponge. The uterine incision was then repaired in 1 l running locked layer using 0 chromic - a second layer was not indicated. The tubes and ovaries were inspected and the gutters cleared of all clots and debris. The uterine incision was inspected again and found to be hemostatic. All instruments and sponges as well as the Alexis retractor were then removed from the abdomen. The peritoneum was then reapproximated in a purse string fashion and the rectus muscle in a loose figure 8 with 2-0 Vicryl. The fascia was then closed with 0 Vicryl in a running fashion. Subcutaneous tissue was reapproximated with 2-0 plain in a running fashion. The skin was closed with a subcuticular stitch of 4-0 Vicryl on a Keith needle and then reinforced with a honeycomb and pressure dressing. At the conclusion of the procedure all instruments and sponge counts were correct. Patient was taken to the recovery room in good condition with her baby accompanying her skin to skin.   

## 2015-07-04 NOTE — Lactation Note (Signed)
This note was copied from a baby's chart. Lactation Consultation Note  Patient Name: Kristy Larsen Speckinglizabeth Salonga XLKGM'WToday's Date: 07/04/2015 Reason for consult: Initial assessment;Infant < 6lbs;Late preterm infant  Visited Mom for Initial assessment at 11 hrs post delivery.  Baby delivered by c-section at 1580w1d, weighing 5 lbs 15 oz.  Baby has breast fed 3 times, using a nipple shield given by RN during the night.  Mom states she used one with her first baby.  (First child late preterm also).  Nipples noted to be semi-flat, or very short nipple shaft.  Areola compressible, but nipple inverts in when breast sandwiched.  Tried to latch baby without nipple shield, but baby unable to attain depth, slipped onto nipple.  Initiated a 20 mm nipple shield on left side.  Mom needing instruction on nipple shield application and use of breast support and sandwiching.  Baby latched and eventually was able to latch deeply onto areola.  Baby demonstrated a vigorous rhythm at the breast, noted deep jaw extensions.  Baby fed 10 mins on left and 20 mins on right, needing occasional stimulation to continue.  No audible swallowing and no colostrum noted in shield post feeding.   Recommended post breast feeding pumping and supplementation per guidelines (5-10 ml) by slow flow nipple.  Mom instructed on using "pace method" bottle feeding to simulate breast milk flow.   History of hypothyroidism, GDM, IVF, Progesterone IM during pregnancy.  Mom states she had an ok milk supply with her first child.    Consult Status Consult Status: Follow-up Date: 07/05/15 Follow-up type: In-patient    Judee ClaraSmith, Wendelin Reader E 07/04/2015, 2:50 PM

## 2015-07-04 NOTE — Transfer of Care (Signed)
Immediate Anesthesia Transfer of Care Note  Patient: Kristy Larsen  Procedure(s) Performed: Procedure(s): CESAREAN SECTION (N/A)  Patient Location: PACU  Anesthesia Type:Spinal  Level of Consciousness: awake, alert  and oriented  Airway & Oxygen Therapy: Patient Spontanous Breathing  Post-op Assessment: Report given to RN and Post -op Vital signs reviewed and stable  Post vital signs: Reviewed and stable  Last Vitals:  Filed Vitals:   07/03/15 2357 07/04/15 0119  BP: 127/80 124/70  Pulse: 89 69  Temp:  37.1 C  Resp:  16    Complications: No apparent anesthesia complications

## 2015-07-04 NOTE — Addendum Note (Signed)
Addendum  created 07/04/15 1609 by Jhonnie GarnerBeth M Wesam Gearhart, CRNA   Modules edited: Clinical Notes   Clinical Notes:  File: [id number]

## 2015-07-04 NOTE — Anesthesia Postprocedure Evaluation (Signed)
Anesthesia Post Note  Patient: Kristy Larsen  Procedure(s) Performed: Procedure(s) (LRB): CESAREAN SECTION (N/A)  Patient location during evaluation: [facility] Anesthesia Type: Spinal Level of consciousness: awake and alert and oriented Pain management: pain level controlled Vital Signs Assessment: post-procedure vital signs reviewed and stable Respiratory status: spontaneous breathing Cardiovascular status: blood pressure returned to baseline Postop Assessment: no headache, no backache, patient able to bend at knees, no signs of nausea or vomiting and adequate PO intake Anesthetic complications: no    Last Vitals:  Filed Vitals:   07/04/15 1000 07/04/15 1400  BP: 107/54 113/60  Pulse: 79 76  Temp: 37.2 C 36.8 C  Resp: 16 18    Last Pain:  Filed Vitals:   07/04/15 1441  PainSc: 3                  Rich Paprocki

## 2015-07-04 NOTE — Plan of Care (Signed)
Problem: Pain Management: Goal: General experience of comfort will improve and pain level will decrease Outcome: Completed/Met Date Met:  07/04/15 Good pain relief with Oxy IR.  Problem: Bowel/Gastric: Goal: Gastrointestinal status will improve Outcome: Completed/Met Date Met:  07/04/15 Tolerates a Regular diet well.  Problem: Urinary Elimination: Goal: Ability to reestablish a normal urinary elimination pattern will improve Outcome: Completed/Met Date Met:  07/04/15 Has voided 3 times without difficulty around 600 cc per void since her Foley was removed.  Problem: Physical Regulation: Goal: Ability to maintain clinical measurements within normal limits will improve Outcome: Completed/Met Date Met:  07/04/15 VSS at this time.  Problem: Tissue Perfusion: Goal: Risk factors for ineffective tissue perfusion will decrease Outcome: Completed/Met Date Met:  07/04/15 Walks in room but encouraged to get out of bed more.Wears SCD hose while in bed.  Problem: Nutrition: Goal: Adequate nutrition will be maintained Outcome: Completed/Met Date Met:  07/04/15 Tolerates a Regular diet well.

## 2015-07-04 NOTE — Consult Note (Addendum)
Neonatology Note:   Attendance at C-section:    I was asked by Dr. Mindi SlickerBanga to attend this primary C/S at 36 1/[redacted] weeks EGA due to PTL with concern for cord prolapse.  The mother is a G2P1, GBS positive. No fever. No antibiotics.  H/o hypothyroidism and GDM. Conceived via IVF.  ROM at delivery, fluid clear. Infant vigorous with good spontaneous cry and tone. 60 sec delayed cord clamping. Needed only minimal bulb suctioning. Ap 8/9. Lungs clear to ausc in DR. Kaiser Sepsis score at birth for EOS is 0.22, very low.  To CN to care of Pediatrician.  Jamie Brookesavid Katisha Shimizu, MD

## 2015-07-04 NOTE — Anesthesia Preprocedure Evaluation (Signed)
Anesthesia Evaluation  Patient identified by MRN, date of birth, ID band Patient awake    Reviewed: Allergy & Precautions, H&P , NPO status , Patient's Chart, lab work & pertinent test results  Airway Mallampati: II  TM Distance: >3 FB Neck ROM: full    Dental no notable dental hx.    Pulmonary neg pulmonary ROS,    Pulmonary exam normal breath sounds clear to auscultation       Cardiovascular negative cardio ROS   Rhythm:regular Rate:Normal     Neuro/Psych  Headaches, negative neurological ROS  negative psych ROS   GI/Hepatic Neg liver ROS, GERD  Medicated,  Endo/Other  negative endocrine ROSdiabetesHypothyroidism   Renal/GU negative Renal ROS     Musculoskeletal   Abdominal   Peds  Hematology negative hematology ROS (+)   Anesthesia Other Findings   Reproductive/Obstetrics (+) Pregnancy                             Anesthesia Physical  Anesthesia Plan  ASA: II  Anesthesia Plan: Spinal   Post-op Pain Management:    Induction:   Airway Management Planned:   Additional Equipment:   Intra-op Plan:   Post-operative Plan:   Informed Consent: I have reviewed the patients History and Physical, chart, labs and discussed the procedure including the risks, benefits and alternatives for the proposed anesthesia with the patient or authorized representative who has indicated his/her understanding and acceptance.   Dental advisory given  Plan Discussed with: CRNA  Anesthesia Plan Comments:         Anesthesia Quick Evaluation

## 2015-07-04 NOTE — Progress Notes (Signed)
Patient ID: Kristy KnucklesElizabeth L Larsen, female   DOB: [birth year], 40 y.o.   MRN: [id number] Subjective: Postpartum Day 0: Cesarean Delivery Patient reports tolerating PO. Pain well controlled, breastfeeding, doing well. No complaints  Objective: Vital signs in last 24 hours: Temp: [98.2 F (36.8 C)-99.2 F (37.3 C)] 98.2 F (36.8 C) (04/08 0931) Pulse Rate: [94-164] 125 (04/08 1101) Resp: [18-20] 20 (04/08 0931) BP: (97-153)/(59-94) 138/88 mmHg (04/08 1101) SpO2: [83 %-100 %] 83 % (04/08 0433) Weight: [210 lb (95.255 kg)] 210 lb (95.255 kg) (04/08 0039)  Physical Exam:  General: alert, cooperative and no distress Lochia: appropriate Uterine Fundus: firm Incision: no significant drainage DVT Evaluation: Negative Homan's sign.   Recent Labs (last 2 labs)      Recent Labs  07/04/15 0252  HGB 13.3  HCT 36.9      Assessment/Plan: Status post Cesarean section. Doing well postoperatively.  Continue current care.

## 2015-07-05 LAB — BIRTH TISSUE RECOVERY COLLECTION (PLACENTA DONATION)

## 2015-07-05 NOTE — Lactation Note (Signed)
This note was copied from a baby's chart. Lactation Consultation Note  Patient Name: Boy Ignatius Speckinglizabeth Sferrazza ZOXWR'UToday's Date: 07/05/2015 Reason for consult: Follow-up assessment;Infant < 6lbs;Late preterm infant   Follow-up visit at 5237 hrs old; GA 36.1; BW 5 lbs, 15.2 oz. Weight Loss 3%.  Mom is a P2.  LS-7 by RN.  Experienced BF previous 284 yr old. Infant has breastfed x6 (10-20 min) + attempt x3 (5-8 min) + EBM x1 (2 ml) + Formula via curved-tip at breast x2 (5 ml); voids-6 in 24 hrs; stools-3 in 24 hrs. Mom has Hx of GDM, and Hypothyroidism on synthroid.  Hx of infertility.   Mom has been using #20 Nipple shield with latching d/t semi-flat nipples, short shaft, and inverts with sandwiching.   Mom has been using DEBP and supplementing with own EBM via curved-tip syringe at breast. Parents were using curved-tip on left breast with #20 NS when LC entered room.  Dad assisting and had given infant 3 ml via curved-tip. LC started 5 [place] SNS with remaining EBM (7 ml) and showed parents how to apply under nipple shield.  Infant's good intraoral pressure pulled EBM from syringe out himself with feeding.   Infant fed for a total of 30 minutes and was satisfied at end of feeding. Mom asked for a smaller nipple shield for a tighter fit; #16 given to try but instructed if it was too tight at next feeding to go back to #20.   Mom's right nipple was cracked with abrasions; mom stated was bleeding yesterday.  Instructed to hand express colostrum at end of feeding/pumping and then apply Comfort Gels.  Instructed on use of comfort gels. Mom plans to continue post-pumping for EBM to feed with 5 [place] SNS at next feeding.   Parents were pleased with current feeding and assistance given.  Encouraged to call for assistance as needed.      Feeding Feeding Type: Breast Fed  LATCH Score/Interventions Latch: Grasps breast easily, tongue down, lips flanged, rhythmical sucking.  Audible Swallowing: A few with  stimulation (score 2 with SNS (5 [place]French))  Type of Nipple: Everted at rest and after stimulation  Comfort (Breast/Nipple): Filling, red/small blisters or bruises, mild/mod discomfort  Problem noted: Cracked, bleeding, blisters, bruises Interventions  (Cracked/bleeding/bruising/blister): Double electric pump Interventions (Mild/moderate discomfort): Comfort gels  Hold (Positioning): Assistance needed to correctly position infant at breast and maintain latch. Intervention(s): Breastfeeding basics reviewed  LATCH Score: 7  Lactation Tools Discussed/Used Tools: Nipple Shields;37F feeding tube / Syringe;Comfort gels Nipple shield size: 20 Breast pump type: Double-Electric Breast Pump   Consult Status Consult Status: Follow-up Date: 07/06/15 Follow-up type: In-patient    Lendon KaVann, Nohely Whitehorn Walker 07/05/2015, 4:45 PM

## 2015-07-05 NOTE — Progress Notes (Signed)
Subjective: Postpartum Day 1: Cesarean Delivery Patient reports tolerating PO, + flatus and no problems voiding.  Bonding well with baby-breastfeeding. Denies n/v, fever or headaches  Objective: Vital signs in last 24 hours: Temp:  [98.2 F (36.8 C)-98.6 F (37 C)] 98.6 F (37 C) (04/09 0009) Pulse Rate:  [76-85] 81 (04/09 0009) Resp:  [16-18] 16 (04/09 0009) BP: (110-113)/(60-67) 110/67 mmHg (04/09 0009) SpO2:  [99 %-100 %] 99 % (04/09 0009)  Physical Exam:  General: alert, cooperative and no distress Lochia: appropriate Uterine Fundus: firm Incision: no significant drainage DVT Evaluation: No evidence of DVT seen on physical exam.   Recent Labs  07/04/15 0030 07/04/15 0714  HGB 13.3 10.8*  HCT 38.6 31.5*    Assessment/Plan: Status post Cesarean section. Doing well postoperatively.  Continue current care.  Kristy Larsen 07/05/2015, 11:13 AM

## 2015-07-06 ENCOUNTER — Encounter (HOSPITAL_COMMUNITY): Payer: Self-pay | Admitting: *Deleted

## 2015-07-06 NOTE — Lactation Note (Signed)
This note was copied from a baby'Larsen chart. Lactation Consultation Note  Follow up visit made.  Mom is currently pumping and obtaining good amounts of transitional milk.  She states baby is nursing well with a 16 mm nipples shield.  Reviewed late preterm behavior.  Stressed importance of pumping every 3 hours to establish and maintain milk supply.  Reminded baby will be inconsistent at breast.  Instructed to increase volume to 20-30 mls.  Mom has 2 DEBP'Larsen at home.  Encouraged to call with questions prn.  Patient Name: Kristy Larsen     Maternal Data    Feeding Feeding Type: Breast Fed Nipple Type: Slow - flow Length of feed: 30 min  LATCH Score/Interventions Latch: Grasps breast easily, tongue down, lips flanged, rhythmical sucking. Intervention(Larsen): Adjust position  Audible Swallowing: A few with stimulation Intervention(Larsen): Skin to skin Intervention(Larsen): Hand expression  Type of Nipple: Everted at rest and after stimulation Intervention(Larsen): Double electric pump  Comfort (Breast/Nipple): Filling, red/small blisters or bruises, mild/mod discomfort  Problem noted: Mild/Moderate discomfort Interventions  (Cracked/bleeding/bruising/blister): Double electric pump Interventions (Mild/moderate discomfort): Comfort gels  Hold (Positioning): Assistance needed to correctly position infant at breast and maintain latch. Intervention(Larsen): Support Pillows;Skin to skin  LATCH Score: 7  Lactation Tools Discussed/Used Nipple shield size: 20   Consult Status      Kristy Larsen Larsen, 10:42 AM

## 2015-07-06 NOTE — Progress Notes (Signed)
Subjective: Postpartum Day 2: Cesarean Delivery Patient reports tolerating PO, + flatus and no problems voiding.  Bonding well with baby. Pain well controlled. Breastfeeding well Objective: Vital signs in last 24 hours: Temp:  [98.2 F (36.8 C)-98.9 F (37.2 C)] 98.6 F (37 C) (04/10 0603) Pulse Rate:  [80-93] 93 (04/10 0603) Resp:  [18-20] 20 (04/10 0603) BP: (108-119)/(56-74) 108/64 mmHg (04/10 0603) SpO2:  [100 %] 100 % (04/10 0603)  Physical Exam:  General: alert, cooperative and no distress Lochia: appropriate Uterine Fundus: firm Incision: no significant drainage DVT Evaluation: No evidence of DVT seen on physical exam.   Recent Labs  07/04/15 0030 07/04/15 0714  HGB 13.3 10.8*  HCT 38.6 31.5*    Assessment/Plan: Status post Cesarean section. Doing well postoperatively.  Continue current care Discharge home in am.  Sharol GivenCecilia Worema Banga 07/06/2015, 7:00 AM

## 2015-07-07 MED ORDER — OXYCODONE-ACETAMINOPHEN 5-325 MG PO TABS: 1.0000 | ORAL_TABLET | ORAL | Status: DC | PRN

## 2015-07-07 MED ORDER — IBUPROFEN 600 MG PO TABS: 600.0000 mg | ORAL_TABLET | Freq: Four times a day (QID) | ORAL | Status: DC

## 2015-07-07 NOTE — Progress Notes (Signed)
Patient ID: Kristy KnucklesElizabeth L Larsen, female   DOB: January 07, 1976, 40 y.o.   MRN: [id number] Pt doing well. Pain well controlled with meds. Ambulating and tolerating diet with no issues. Lochia mild. No fever or chills. Voiding well. Bonding well with baby- breastfeeding going well Ready for discharge to home today VSS ABD_ soft, nd dressing c/d/i EXT- No homans  A/P: POD#2 s/p pltcs-stable         Discharge to home today         Instructions reviewed

## 2015-07-07 NOTE — Discharge Summary (Signed)
OB Discharge Summary     Patient Name: Kristy Larsen DOB: 04-18-1975 MRN: [id number]  Date of admission: 07/03/2015 Delivering MD: Pryor Ochoa [facility]   Date of discharge: 07/07/2015  Admitting diagnosis: 36w labor pain  Intrauterine pregnancy: [redacted]w[redacted]d     Secondary diagnosis:  Active Problems:   Preterm labor   Presentation of cord   Postpartum care following cesarean delivery  Additional problems: none     Discharge diagnosis: Preterm Pregnancy Delivered                                                                                                Post partum procedures:none  Augmentation: n/a  Complications: None  Hospital course:  Onset of Labor With Unplanned C/S  40 y.o. yo G2P0202 at [redacted]w[redacted]d was admitted in Active Labor on 07/03/2015. Patient had a labor course significant for presenting vertex with multiple loops of umbilical cord ahead of vertex; intact membranes. Due to risk of cord prolapse pt was advised to have a primary cesarean section urgently. Membrane Rupture Time/Date: 2:13 AM ,07/04/2015   The patient went for cesarean section due to Malpresentation and risk of cord prolapse, and delivered a Viable infant,07/04/2015  Details of operation can be found in separate operative note. Patient had an uncomplicated postpartum course.  She is ambulating,tolerating a regular diet, passing flatus, and urinating well.  Patient is discharged home in stable condition 07/07/2015.  Physical exam  Filed Vitals:   07/06/15 1204 07/06/15 1748 07/06/15 2100 07/07/15 0542  BP: 123/70 131/65 121/63 120/46  Pulse: 88 78 85 77  Temp: 98.1 F (36.7 C) 98.8 F (37.1 C) 98.8 F (37.1 C) 98 F (36.7 C)  TempSrc: Axillary Oral Oral Oral  Resp: Height:      Weight:      SpO2: 100% 100% 100% 100%   General: alert, cooperative and no distress Lochia: appropriate Uterine Fundus: firm Incision: Healing well with no significant drainage DVT Evaluation: No evidence of DVT  seen on physical exam. Labs: Lab Results  Component Value Date   WBC 13.6* 07/04/2015   HGB 10.8* 07/04/2015   HCT 31.5* 07/04/2015   MCV 89.7 07/04/2015   PLT 122* 07/04/2015   No flowsheet data found.  Discharge instruction: per After Visit Summary and "Baby and Me Booklet".  After visit meds:    Medication List    TAKE these medications        DHA OMEGA 3 PO  Take 1 capsule by mouth daily.     ibuprofen 600 MG tablet  Commonly known as:  ADVIL,MOTRIN  Take 1 tablet (600 mg total) by mouth every 6 (six) hours.     levothyroxine 125 MCG tablet  Commonly known as:  SYNTHROID, LEVOTHROID  Take 125 mcg by mouth daily.     liothyronine 5 MCG tablet  Commonly known as:  CYTOMEL  Take 5 mcg by mouth daily.     oxyCODONE-acetaminophen 5-325 MG tablet  Commonly known as:  ROXICET  Take 1 tablet by mouth every 4 (four) hours as needed for severe pain.  prenatal multivitamin Tabs tablet  Take 1 tablet by mouth daily.     ranitidine 150 MG tablet  Commonly known as:  ZANTAC  Take 150 mg by mouth 2 (two) times daily.        Diet: routine diet  Activity: Advance as tolerated. Pelvic rest for 6 weeks.   Outpatient follow up:2 weeks Follow up Appt:No future appointments. Follow up Visit:No Follow-up on file.  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

## 2015-07-07 NOTE — Lactation Note (Signed)
This note was copied from a baby'Larsen chart. Lactation Consultation Note  Follow up visit made prior to discharge.  Mom'Larsen breasts are full but not engorged.  Baby is breastfeeding well using a nipple shield.  Mom continues to post pump and give baby expressed milk per bottle.  Obtaining 30-50 mls.  Instructed to use heat and massage prior to and during feeding and pumping.  Mom will continue to put baby to breast with feeding cues, post pump and give expressed milk to baby.  Discussed progression of late preterm feeding.  Reminded her baby may need to be closer to term before transferring full feeds.  Recommended lactation outpatient appointment and mom states she will call.  Patient Name: Kristy Larsen AVWUJ'WToday'Larsen Date: 07/07/2015     Maternal Data    Feeding Feeding Type: Breast Fed Length of feed: 24 min  LATCH Score/Interventions                      Lactation Tools Discussed/Used     Consult Status      Huston FoleyMOULDEN, Kristy Larsen 07/07/2015, 12:27 PM

## 2015-07-07 NOTE — Progress Notes (Signed)
Teaching complete  Baby with pt and husband

## 2015-07-07 NOTE — Discharge Instructions (Signed)
Nothing in vagina for 6 weeks.  No sex, tampons, and douching.  Other instructions as in [facility] Discharge Booklet. °

## 2015-07-08 LAB — TYPE AND SCREEN
ABO/RH(D): A NEG
Antibody Screen: POSITIVE
DAT, IGG: NEGATIVE
Unit division: 0
Unit division: 0

## 2015-07-20 DIAGNOSIS — Z4889 Encounter for other specified surgical aftercare: Secondary | ICD-10-CM | POA: Diagnosis not present

## 2015-07-21 DIAGNOSIS — Z319 Encounter for procreative management, unspecified: Secondary | ICD-10-CM | POA: Diagnosis not present

## 2015-08-11 DIAGNOSIS — E039 Hypothyroidism, unspecified: Secondary | ICD-10-CM | POA: Diagnosis not present

## 2015-08-17 DIAGNOSIS — Z3009 Encounter for other general counseling and advice on contraception: Secondary | ICD-10-CM | POA: Diagnosis not present

## 2015-08-17 DIAGNOSIS — Z1389 Encounter for screening for other disorder: Secondary | ICD-10-CM | POA: Diagnosis not present

## 2015-10-06 DIAGNOSIS — N644 Mastodynia: Secondary | ICD-10-CM | POA: Diagnosis not present

## 2015-10-07 DIAGNOSIS — E039 Hypothyroidism, unspecified: Secondary | ICD-10-CM | POA: Diagnosis not present

## 2015-11-23 DIAGNOSIS — R05 Cough: Secondary | ICD-10-CM | POA: Diagnosis not present

## 2015-11-23 DIAGNOSIS — R062 Wheezing: Secondary | ICD-10-CM | POA: Diagnosis not present

## 2015-12-11 DIAGNOSIS — J019 Acute sinusitis, unspecified: Secondary | ICD-10-CM | POA: Diagnosis not present

## 2016-01-07 DIAGNOSIS — R112 Nausea with vomiting, unspecified: Secondary | ICD-10-CM | POA: Diagnosis not present

## 2016-01-07 DIAGNOSIS — K529 Noninfective gastroenteritis and colitis, unspecified: Secondary | ICD-10-CM | POA: Diagnosis not present

## 2016-03-15 DIAGNOSIS — E039 Hypothyroidism, unspecified: Secondary | ICD-10-CM | POA: Diagnosis not present

## 2016-04-19 DIAGNOSIS — Z23 Encounter for immunization: Secondary | ICD-10-CM | POA: Diagnosis not present

## 2016-04-28 DIAGNOSIS — E559 Vitamin D deficiency, unspecified: Secondary | ICD-10-CM | POA: Diagnosis not present

## 2016-04-28 DIAGNOSIS — E039 Hypothyroidism, unspecified: Secondary | ICD-10-CM | POA: Diagnosis not present

## 2016-06-29 DIAGNOSIS — R5383 Other fatigue: Secondary | ICD-10-CM | POA: Diagnosis not present

## 2016-06-29 DIAGNOSIS — G4700 Insomnia, unspecified: Secondary | ICD-10-CM | POA: Diagnosis not present

## 2016-06-29 DIAGNOSIS — E039 Hypothyroidism, unspecified: Secondary | ICD-10-CM | POA: Diagnosis not present

## 2016-06-29 DIAGNOSIS — E559 Vitamin D deficiency, unspecified: Secondary | ICD-10-CM | POA: Diagnosis not present

## 2016-06-29 DIAGNOSIS — R790 Abnormal level of blood mineral: Secondary | ICD-10-CM | POA: Diagnosis not present

## 2016-08-04 DIAGNOSIS — R5383 Other fatigue: Secondary | ICD-10-CM | POA: Diagnosis not present

## 2016-08-04 DIAGNOSIS — R790 Abnormal level of blood mineral: Secondary | ICD-10-CM | POA: Diagnosis not present

## 2016-08-04 DIAGNOSIS — G4700 Insomnia, unspecified: Secondary | ICD-10-CM | POA: Diagnosis not present

## 2016-08-04 DIAGNOSIS — E039 Hypothyroidism, unspecified: Secondary | ICD-10-CM | POA: Diagnosis not present

## 2016-08-04 DIAGNOSIS — E559 Vitamin D deficiency, unspecified: Secondary | ICD-10-CM | POA: Diagnosis not present

## 2016-10-11 DIAGNOSIS — M542 Cervicalgia: Secondary | ICD-10-CM | POA: Diagnosis not present

## 2016-10-11 DIAGNOSIS — M25511 Pain in right shoulder: Secondary | ICD-10-CM | POA: Diagnosis not present

## 2016-10-21 DIAGNOSIS — E039 Hypothyroidism, unspecified: Secondary | ICD-10-CM | POA: Diagnosis not present

## 2017-01-25 DIAGNOSIS — Z23 Encounter for immunization: Secondary | ICD-10-CM | POA: Diagnosis not present

## 2017-01-25 DIAGNOSIS — Z1231 Encounter for screening mammogram for malignant neoplasm of breast: Secondary | ICD-10-CM | POA: Diagnosis not present

## 2017-01-25 DIAGNOSIS — N920 Excessive and frequent menstruation with regular cycle: Secondary | ICD-10-CM | POA: Diagnosis not present

## 2017-01-25 DIAGNOSIS — Z01419 Encounter for gynecological examination (general) (routine) without abnormal findings: Secondary | ICD-10-CM | POA: Diagnosis not present

## 2017-01-25 DIAGNOSIS — Z1389 Encounter for screening for other disorder: Secondary | ICD-10-CM | POA: Diagnosis not present

## 2017-02-08 DIAGNOSIS — N920 Excessive and frequent menstruation with regular cycle: Secondary | ICD-10-CM | POA: Diagnosis not present

## 2017-04-25 DIAGNOSIS — R5383 Other fatigue: Secondary | ICD-10-CM | POA: Diagnosis not present

## 2017-04-25 DIAGNOSIS — E039 Hypothyroidism, unspecified: Secondary | ICD-10-CM | POA: Diagnosis not present

## 2017-04-25 DIAGNOSIS — J069 Acute upper respiratory infection, unspecified: Secondary | ICD-10-CM | POA: Diagnosis not present

## 2017-08-29 DIAGNOSIS — R5383 Other fatigue: Secondary | ICD-10-CM | POA: Diagnosis not present

## 2017-08-29 DIAGNOSIS — H6980 Other specified disorders of Eustachian tube, unspecified ear: Secondary | ICD-10-CM | POA: Diagnosis not present

## 2017-09-08 DIAGNOSIS — K529 Noninfective gastroenteritis and colitis, unspecified: Secondary | ICD-10-CM | POA: Diagnosis not present

## 2017-09-08 DIAGNOSIS — R69 Illness, unspecified: Secondary | ICD-10-CM | POA: Diagnosis not present

## 2018-01-16 DIAGNOSIS — M25572 Pain in left ankle and joints of left foot: Secondary | ICD-10-CM | POA: Diagnosis not present

## 2018-02-08 DIAGNOSIS — S93492D Sprain of other ligament of left ankle, subsequent encounter: Secondary | ICD-10-CM | POA: Diagnosis not present

## 2018-02-08 DIAGNOSIS — M6281 Muscle weakness (generalized): Secondary | ICD-10-CM | POA: Diagnosis not present

## 2018-02-15 DIAGNOSIS — S93492D Sprain of other ligament of left ankle, subsequent encounter: Secondary | ICD-10-CM | POA: Diagnosis not present

## 2018-02-15 DIAGNOSIS — M6281 Muscle weakness (generalized): Secondary | ICD-10-CM | POA: Diagnosis not present

## 2018-02-20 DIAGNOSIS — Z1231 Encounter for screening mammogram for malignant neoplasm of breast: Secondary | ICD-10-CM | POA: Diagnosis not present

## 2018-02-20 DIAGNOSIS — Z01419 Encounter for gynecological examination (general) (routine) without abnormal findings: Secondary | ICD-10-CM | POA: Diagnosis not present

## 2018-02-20 DIAGNOSIS — Z1389 Encounter for screening for other disorder: Secondary | ICD-10-CM | POA: Diagnosis not present

## 2018-02-20 DIAGNOSIS — Z6832 Body mass index (BMI) 32.0-32.9, adult: Secondary | ICD-10-CM | POA: Diagnosis not present

## 2018-02-20 DIAGNOSIS — E079 Disorder of thyroid, unspecified: Secondary | ICD-10-CM | POA: Diagnosis not present

## 2018-02-20 DIAGNOSIS — Z130 Encounter for screening for diseases of the blood and blood-forming organs and certain disorders involving the immune mechanism: Secondary | ICD-10-CM | POA: Diagnosis not present

## 2018-02-20 DIAGNOSIS — S93492D Sprain of other ligament of left ankle, subsequent encounter: Secondary | ICD-10-CM | POA: Diagnosis not present

## 2018-02-20 DIAGNOSIS — M6281 Muscle weakness (generalized): Secondary | ICD-10-CM | POA: Diagnosis not present

## 2018-02-27 DIAGNOSIS — M25572 Pain in left ankle and joints of left foot: Secondary | ICD-10-CM | POA: Diagnosis not present

## 2018-02-27 DIAGNOSIS — S93492D Sprain of other ligament of left ankle, subsequent encounter: Secondary | ICD-10-CM | POA: Diagnosis not present

## 2018-02-27 DIAGNOSIS — M6281 Muscle weakness (generalized): Secondary | ICD-10-CM | POA: Diagnosis not present

## 2018-02-27 DIAGNOSIS — M25672 Stiffness of left ankle, not elsewhere classified: Secondary | ICD-10-CM | POA: Diagnosis not present

## 2018-02-28 ENCOUNTER — Other Ambulatory Visit: Payer: Self-pay | Admitting: Obstetrics and Gynecology

## 2018-02-28 DIAGNOSIS — R928 Other abnormal and inconclusive findings on diagnostic imaging of breast: Secondary | ICD-10-CM

## 2018-03-02 ENCOUNTER — Ambulatory Visit
Admission: RE | Admit: 2018-03-02 | Discharge: 2018-03-02 | Disposition: A | Discharge status: Home / Self Care | Payer: 59 | Source: Ambulatory Visit | Attending: Obstetrics and Gynecology | Admitting: Obstetrics and Gynecology

## 2018-03-02 ENCOUNTER — Encounter (INDEPENDENT_AMBULATORY_CARE_PROVIDER_SITE_OTHER): Payer: Self-pay

## 2018-03-02 ENCOUNTER — Ambulatory Visit: Payer: Managed Care, Other (non HMO)

## 2018-03-02 DIAGNOSIS — R928 Other abnormal and inconclusive findings on diagnostic imaging of breast: Secondary | ICD-10-CM

## 2019-03-05 ENCOUNTER — Other Ambulatory Visit: Payer: Self-pay | Admitting: Obstetrics and Gynecology

## 2019-03-05 DIAGNOSIS — N6489 Other specified disorders of breast: Secondary | ICD-10-CM

## 2019-03-11 ENCOUNTER — Ambulatory Visit
Admission: RE | Admit: 2019-03-11 | Discharge: 2019-03-11 | Disposition: A | Discharge status: Home / Self Care | Payer: 59 | Source: Ambulatory Visit | Attending: Obstetrics and Gynecology | Admitting: Obstetrics and Gynecology

## 2019-03-11 ENCOUNTER — Other Ambulatory Visit: Payer: Self-pay

## 2019-03-11 ENCOUNTER — Other Ambulatory Visit: Payer: Self-pay | Admitting: Obstetrics and Gynecology

## 2019-03-11 ENCOUNTER — Other Ambulatory Visit: Payer: 59

## 2019-03-11 DIAGNOSIS — N6489 Other specified disorders of breast: Secondary | ICD-10-CM

## 2019-03-15 ENCOUNTER — Other Ambulatory Visit: Payer: Self-pay

## 2019-03-15 ENCOUNTER — Ambulatory Visit
Admission: RE | Admit: 2019-03-15 | Discharge: 2019-03-15 | Disposition: A | Discharge status: Home / Self Care | Payer: 59 | Source: Ambulatory Visit | Attending: Obstetrics and Gynecology | Admitting: Obstetrics and Gynecology

## 2019-03-15 DIAGNOSIS — N6489 Other specified disorders of breast: Secondary | ICD-10-CM

## 2019-05-24 ENCOUNTER — Other Ambulatory Visit: Payer: Self-pay

## 2019-05-24 ENCOUNTER — Encounter (HOSPITAL_BASED_OUTPATIENT_CLINIC_OR_DEPARTMENT_OTHER): Payer: Self-pay | Admitting: Obstetrics and Gynecology

## 2019-05-24 NOTE — Progress Notes (Signed)
Spoke w/ via phone for pre-op interview--- PT Lab needs dos----   Urine preg (anes.); pre-op orders pending            Lab results------ no COVID test ------ 05-25-2019 @ 0940 Arrive at -------  0630 NPO after ------  MN Medications to take morning of surgery ----- Synthroid and Tagament w/ sips of water Diabetic medication ----- n/a Patient Special Instructions ----- n/a Pre-Op special Istructions ----- pre-op orders requested Patient verbalized understanding of instructions that were given at this phone interview. Patient denies shortness of breath, chest pain, fever, cough a this phone interview.

## 2019-05-25 ENCOUNTER — Other Ambulatory Visit (HOSPITAL_COMMUNITY)
Admission: RE | Admit: 2019-05-25 | Discharge: 2019-05-25 | Disposition: A | Discharge status: Home / Self Care | Payer: 59 | Source: Ambulatory Visit | Attending: Obstetrics and Gynecology | Admitting: Obstetrics and Gynecology

## 2019-05-25 DIAGNOSIS — Z01812 Encounter for preprocedural laboratory examination: Secondary | ICD-10-CM | POA: Diagnosis present

## 2019-05-25 DIAGNOSIS — Z20822 Contact with and (suspected) exposure to covid-19: Secondary | ICD-10-CM | POA: Diagnosis not present

## 2019-05-25 LAB — SARS CORONAVIRUS 2 (TAT 6-24 HRS): SARS Coronavirus 2: NEGATIVE

## 2019-05-29 ENCOUNTER — Ambulatory Visit (HOSPITAL_BASED_OUTPATIENT_CLINIC_OR_DEPARTMENT_OTHER)
Admission: RE | Admit: 2019-05-29 | Discharge: 2019-05-29 | Disposition: A | Discharge status: Home / Self Care | Payer: 59 | Attending: Obstetrics and Gynecology | Admitting: Obstetrics and Gynecology

## 2019-05-29 ENCOUNTER — Encounter (HOSPITAL_BASED_OUTPATIENT_CLINIC_OR_DEPARTMENT_OTHER): Payer: Self-pay | Admitting: Obstetrics and Gynecology

## 2019-05-29 ENCOUNTER — Ambulatory Visit (HOSPITAL_BASED_OUTPATIENT_CLINIC_OR_DEPARTMENT_OTHER): Payer: 59 | Admitting: Anesthesiology

## 2019-05-29 ENCOUNTER — Encounter (HOSPITAL_BASED_OUTPATIENT_CLINIC_OR_DEPARTMENT_OTHER)
Admission: RE | Disposition: A | Discharge status: Home / Self Care | Payer: Self-pay | Source: Home / Self Care | Attending: Obstetrics and Gynecology

## 2019-05-29 DIAGNOSIS — Z886 Allergy status to analgesic agent status: Secondary | ICD-10-CM | POA: Diagnosis not present

## 2019-05-29 DIAGNOSIS — K219 Gastro-esophageal reflux disease without esophagitis: Secondary | ICD-10-CM | POA: Diagnosis not present

## 2019-05-29 DIAGNOSIS — N9489 Other specified conditions associated with female genital organs and menstrual cycle: Secondary | ICD-10-CM | POA: Diagnosis not present

## 2019-05-29 DIAGNOSIS — N840 Polyp of corpus uteri: Secondary | ICD-10-CM | POA: Diagnosis not present

## 2019-05-29 DIAGNOSIS — E039 Hypothyroidism, unspecified: Secondary | ICD-10-CM | POA: Insufficient documentation

## 2019-05-29 DIAGNOSIS — Z79890 Hormone replacement therapy (postmenopausal): Secondary | ICD-10-CM | POA: Diagnosis not present

## 2019-05-29 DIAGNOSIS — Z888 Allergy status to other drugs, medicaments and biological substances status: Secondary | ICD-10-CM | POA: Insufficient documentation

## 2019-05-29 DIAGNOSIS — N939 Abnormal uterine and vaginal bleeding, unspecified: Secondary | ICD-10-CM | POA: Insufficient documentation

## 2019-05-29 DIAGNOSIS — E559 Vitamin D deficiency, unspecified: Secondary | ICD-10-CM | POA: Insufficient documentation

## 2019-05-29 DIAGNOSIS — Z79899 Other long term (current) drug therapy: Secondary | ICD-10-CM | POA: Insufficient documentation

## 2019-05-29 DIAGNOSIS — Z885 Allergy status to narcotic agent status: Secondary | ICD-10-CM | POA: Diagnosis not present

## 2019-05-29 DIAGNOSIS — E282 Polycystic ovarian syndrome: Secondary | ICD-10-CM | POA: Insufficient documentation

## 2019-05-29 HISTORY — DX: Presence of spectacles and contact lenses: Z97.3

## 2019-05-29 HISTORY — DX: Vitamin D deficiency, unspecified: E55.9

## 2019-05-29 HISTORY — PX: HYSTEROSCOPY WITH D & C: SHX1775

## 2019-05-29 HISTORY — DX: Personal history of gestational diabetes: Z86.32

## 2019-05-29 HISTORY — DX: Abnormal uterine and vaginal bleeding, unspecified: N93.9

## 2019-05-29 HISTORY — DX: Gastro-esophageal reflux disease without esophagitis: K21.9

## 2019-05-29 LAB — POCT PREGNANCY, URINE: Preg Test, Ur: NEGATIVE

## 2019-05-29 LAB — ABO/RH: ABO/RH(D): A NEG

## 2019-05-29 LAB — TYPE AND SCREEN
ABO/RH(D): A NEG
Antibody Screen: NEGATIVE

## 2019-05-29 SURGERY — DILATATION AND CURETTAGE /HYSTEROSCOPY
Anesthesia: General | Site: Uterus

## 2019-05-29 MED ORDER — OXYCODONE HCL 5 MG PO TABS: 5.0000 mg | ORAL_TABLET | Freq: Four times a day (QID) | ORAL | 0 refills | Status: AC | PRN

## 2019-05-29 MED ORDER — ACETAMINOPHEN 500 MG PO TABS
ORAL_TABLET | ORAL | Status: AC
  Filled 2019-05-29: qty 2

## 2019-05-29 MED ORDER — SCOPOLAMINE 1 MG/3DAYS TD PT72
MEDICATED_PATCH | TRANSDERMAL | Status: AC
  Filled 2019-05-29: qty 1

## 2019-05-29 MED ORDER — FENTANYL CITRATE (PF) 100 MCG/2ML IJ SOLN
INTRAMUSCULAR | Status: AC
  Filled 2019-05-29: qty 2

## 2019-05-29 MED ORDER — PROPOFOL 10 MG/ML IV BOLUS
INTRAVENOUS | Status: AC
  Filled 2019-05-29: qty 40

## 2019-05-29 MED ORDER — LIDOCAINE 2% (20 MG/ML) 5 ML SYRINGE
INTRAMUSCULAR | Status: DC | PRN
  Administered 2019-05-29: 60 mg via INTRAVENOUS

## 2019-05-29 MED ORDER — ONDANSETRON HCL 4 MG/2ML IJ SOLN
INTRAMUSCULAR | Status: DC | PRN
  Administered 2019-05-29: 4 mg via INTRAVENOUS

## 2019-05-29 MED ORDER — MIDAZOLAM HCL 2 MG/2ML IJ SOLN
INTRAMUSCULAR | Status: AC
  Filled 2019-05-29: qty 2

## 2019-05-29 MED ORDER — FENTANYL CITRATE (PF) 100 MCG/2ML IJ SOLN
INTRAMUSCULAR | Status: DC | PRN
  Administered 2019-05-29: 25 ug via INTRAVENOUS

## 2019-05-29 MED ORDER — OXYCODONE HCL 5 MG PO TABS
ORAL_TABLET | ORAL | Status: AC
  Filled 2019-05-29: qty 1

## 2019-05-29 MED ORDER — ACETAMINOPHEN 500 MG PO TABS
1000.0000 mg | ORAL_TABLET | Freq: Once | ORAL | Status: AC
  Administered 2019-05-29: 1000 mg via ORAL
  Filled 2019-05-29: qty 2

## 2019-05-29 MED ORDER — KETOROLAC TROMETHAMINE 30 MG/ML IJ SOLN
INTRAMUSCULAR | Status: DC | PRN
  Administered 2019-05-29: 30 mg via INTRAVENOUS

## 2019-05-29 MED ORDER — LIDOCAINE HCL 1 % IJ SOLN
INTRAMUSCULAR | Status: DC | PRN
  Administered 2019-05-29: 8 mL

## 2019-05-29 MED ORDER — SCOPOLAMINE 1 MG/3DAYS TD PT72
1.0000 | MEDICATED_PATCH | TRANSDERMAL | Status: DC
  Administered 2019-05-29: 1.5 mg via TRANSDERMAL
  Filled 2019-05-29: qty 1

## 2019-05-29 MED ORDER — LIDOCAINE 2% (20 MG/ML) 5 ML SYRINGE
INTRAMUSCULAR | Status: AC
  Filled 2019-05-29: qty 5

## 2019-05-29 MED ORDER — SOD CITRATE-CITRIC ACID 500-334 MG/5ML PO SOLN
30.0000 mL | ORAL | Status: DC
  Filled 2019-05-29: qty 30

## 2019-05-29 MED ORDER — SODIUM CHLORIDE 0.9 % IR SOLN
Status: DC | PRN
  Administered 2019-05-29: 1000 mL

## 2019-05-29 MED ORDER — FENTANYL CITRATE (PF) 100 MCG/2ML IJ SOLN
25.0000 ug | INTRAMUSCULAR | Status: DC | PRN
  Filled 2019-05-29: qty 1

## 2019-05-29 MED ORDER — PROPOFOL 10 MG/ML IV BOLUS
INTRAVENOUS | Status: DC | PRN
  Administered 2019-05-29: 200 mg via INTRAVENOUS

## 2019-05-29 MED ORDER — OXYCODONE HCL 5 MG PO TABS
5.0000 mg | ORAL_TABLET | Freq: Once | ORAL | Status: AC
  Administered 2019-05-29: 10:00:00 5 mg via ORAL
  Filled 2019-05-29: qty 1

## 2019-05-29 MED ORDER — IBUPROFEN 800 MG PO TABS: 800.0000 mg | ORAL_TABLET | Freq: Three times a day (TID) | ORAL | 0 refills | Status: AC | PRN

## 2019-05-29 MED ORDER — KETOROLAC TROMETHAMINE 30 MG/ML IJ SOLN
INTRAMUSCULAR | Status: AC
  Filled 2019-05-29: qty 1

## 2019-05-29 MED ORDER — MIDAZOLAM HCL 5 MG/5ML IJ SOLN
INTRAMUSCULAR | Status: DC | PRN
  Administered 2019-05-29: 2 mg via INTRAVENOUS

## 2019-05-29 MED ORDER — DEXAMETHASONE SODIUM PHOSPHATE 10 MG/ML IJ SOLN
INTRAMUSCULAR | Status: DC | PRN
  Administered 2019-05-29: 10 mg via INTRAVENOUS

## 2019-05-29 MED ORDER — LACTATED RINGERS IV SOLN
INTRAVENOUS | Status: DC
  Filled 2019-05-29: qty 1000

## 2019-05-29 MED ORDER — DEXAMETHASONE SODIUM PHOSPHATE 10 MG/ML IJ SOLN
INTRAMUSCULAR | Status: AC
  Filled 2019-05-29: qty 1

## 2019-05-29 MED ORDER — ONDANSETRON HCL 4 MG/2ML IJ SOLN
INTRAMUSCULAR | Status: AC
  Filled 2019-05-29: qty 2

## 2019-05-29 SURGICAL SUPPLY — 19 items
BIPOLAR CUTTING LOOP 21FR (ELECTRODE)
CATH ROBINSON RED A/P 16FR (CATHETERS) ×3 IMPLANT
DEVICE MYOSURE LITE (MISCELLANEOUS) ×2 IMPLANT
ELECT REM PT RETURN 9FT ADLT (ELECTROSURGICAL)
ELECTRODE REM PT RTRN 9FT ADLT (ELECTROSURGICAL) IMPLANT
GLOVE BIO SURGEON STRL SZ 6 (GLOVE) ×3 IMPLANT
GLOVE BIOGEL PI IND STRL 6.5 (GLOVE) ×1 IMPLANT
GLOVE BIOGEL PI IND STRL 7.0 (GLOVE) ×2 IMPLANT
GLOVE BIOGEL PI INDICATOR 6.5 (GLOVE) ×2
GLOVE BIOGEL PI INDICATOR 7.0 (GLOVE) ×4
GOWN STRL REUS W/ TWL LRG LVL3 (GOWN DISPOSABLE) ×2 IMPLANT
GOWN STRL REUS W/TWL LRG LVL3 (GOWN DISPOSABLE) ×6
KIT PROCEDURE FLUENT (KITS) ×3 IMPLANT
KIT TURNOVER CYSTO (KITS) ×3 IMPLANT
LOOP CUTTING BIPOLAR 21FR (ELECTRODE) IMPLANT
PACK VAGINAL MINOR WOMEN LF (CUSTOM PROCEDURE TRAY) ×3 IMPLANT
PAD OB MATERNITY 4.3X12.25 (PERSONAL CARE ITEMS) ×3 IMPLANT
SEAL ROD LENS SCOPE MYOSURE (ABLATOR) ×2 IMPLANT
TOWEL OR 17X26 10 PK STRL BLUE (TOWEL DISPOSABLE) ×4 IMPLANT

## 2019-05-29 NOTE — H&P (Addendum)
Kristy Larsen is an 44 y.o. female. Who presents today for scheduled hysteroscopy D&C. 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    Past Medical History:  Diagnosis Date  . Abnormal uterine bleeding (AUB)   . GERD (gastroesophageal reflux disease)   . History of gestational diabetes   . Hypothyroid    followed by pcp  . Vitamin D deficiency   . Wears glasses     Past Surgical History:  Procedure Laterality Date  . CESAREAN SECTION N/A 07/04/2015   Procedure: CESAREAN SECTION;  Surgeon: Edwinna Areola, DO;  Location: [facility];  Service: Obstetrics;  Laterality: N/A;  . PILONIDAL CYST EXCISION  age 62  . WISDOM TOOTH EXTRACTION  [age]    History reviewed. No pertinent family history.  Social History:  reports that she has never smoked. She has never used smokeless tobacco. She reports current alcohol use. She reports that she does not use drugs.  Allergies:  Allergies  Allergen Reactions  . Aspirin Other (See Comments)    "Thins blood out too much"  . Codeine Other (See Comments)    Passes out   . Hydrocodone Other (See Comments)    Passes out  . Sudafed [Pseudoephedrine Hcl] Palpitations    Medications Prior to Admission  Medication Sig Dispense Refill Last Dose  . Cholecalciferol (VITAMIN D3)  50 MCG (2000 UT) TABS Take 1 tablet by mouth daily.     . cimetidine (TAGAMET HB) 200 MG tablet Take 200 mg by mouth as needed.     . Ferrous Sulfate (IRON PO) Take by mouth as needed (takes during menstral cycle). OTC     . levothyroxine (SYNTHROID) 100 MCG tablet Take 100 mcg by mouth daily before breakfast.     . Probiotic CAPS Take 1 capsule by mouth daily.       Review of Systems  Constitutional: Negative for chills and fever.  Respiratory: Negative for shortness of breath.   Cardiovascular: Negative for chest pain, palpitations and leg swelling.  Gastrointestinal: Negative for abdominal pain, nausea and vomiting.  Neurological: Negative for dizziness, weakness and headaches.  Psychiatric/Behavioral: Negative for suicidal ideas.    Height 5' (1.524 m), weight 68 kg, last menstrual period 05/03/2019, not currently breastfeeding. Physical Exam Gen: NAD, AAOx3 CV: CTAB, RRR Abd: soft, NTTP, BSx4 MSK: neg calf edema/Homan's BL Psych: mood and affect normal  Results for orders placed or performed during the hospital encounter of 05/29/19 (from the past 24 hour(s))  Pregnancy, urine POC     Status: None   Collection Time: 05/29/19  6:42 AM  Result Value Ref Range   Preg Test, Ur NEGATIVE NEGATIVE    No results found.  Assessment/Plan: This is a 43yo [id number] presenting for scheduled hysteroscopy D&C w/ Myosure resection for surgical management of AUB-IMB.   The patient was informed of the risks and benefits of a hysteroscopy with dilation and curettage. Risks included but were not limited to bleeidng, infections, injury  to the vulva, vagina or cerivx, or uterine perforation. If concern for latter, may proceed with diagnostic laparoscopy for evaluation of injury which may require surgical repair. Patient understands and is amenable.   Kristy Larsen 05/29/2019, 7:21 AM

## 2019-05-29 NOTE — Anesthesia Postprocedure Evaluation (Signed)
Anesthesia Post Note  Patient: Kristy Larsen  Procedure(s) Performed: DILATATION AND CURETTAGE /HYSTEROSCOPY (N/A Uterus)     Patient location during evaluation: PACU Anesthesia Type: General Level of consciousness: awake and alert Pain management: pain level controlled Vital Signs Assessment: post-procedure vital signs reviewed and stable Respiratory status: spontaneous breathing, nonlabored ventilation, respiratory function stable and patient connected to nasal cannula oxygen Cardiovascular status: blood pressure returned to baseline and stable Postop Assessment: no apparent nausea or vomiting Anesthetic complications: no    Last Vitals:  Vitals:   05/29/19 0902 05/29/19 0915  BP: 118/71 106/65  Pulse: 63 (!) 56  Resp: 14 13  Temp: (!) 36.3 C   SpO2: 100% 100%    Last Pain:  Vitals:   05/29/19 0736  TempSrc: Oral  PainSc: 0-No pain                 [name] L Kiree Dejarnette

## 2019-05-29 NOTE — Discharge Instructions (Signed)
   D & C Home care Instructions:   Personal hygiene:  Used sanitary napkins for vaginal drainage not tampons. Shower or tub bathe the day after your procedure. No douching until bleeding stops. Always wipe from front to back after  Elimination.  Activity: Do not drive or operate any equipment today. The effects of the anesthesia are still present and drowsiness may result. Rest today, not necessarily flat bed rest, just take it easy. You may resume your normal activity in one to 2 days.  Sexual activity: No intercourse for one week or as indicated by your physician  Diet: Eat a light diet as desired this evening. You may resume a regular diet tomorrow.  Return to work: One to 2 days.  General Expectations of your surgery: Vaginal bleeding should be no heavier than a normal period. Spotting may continue up to 10 days. Mild cramps may continue for a couple of days. You may have a regular period in 2-6 weeks.  Unexpected observations call your doctor if these occur: persistent or heavy bleeding. Severe abdominal cramping or pain.    Post Anesthesia Home Care Instructions  Activity: Get plenty of rest for the remainder of the day. A responsible individual must stay with you for 24 hours following the procedure.  For the next 24 hours, DO NOT: -Drive a car -Advertising copywriter -Drink alcoholic beverages -Take any medication unless instructed by your physician -Make any legal decisions or sign important papers.  Meals: Start with liquid foods such as gelatin or soup. Progress to regular foods as tolerated. Avoid greasy, spicy, heavy foods. If nausea and/or vomiting occur, drink only clear liquids until the nausea and/or vomiting subsides. Call your physician if vomiting continues.  Special Instructions/Symptoms: Your throat may feel dry or sore from the anesthesia or the breathing tube placed in your throat during surgery. If this causes discomfort, gargle with warm salt water. The  discomfort should disappear within 24 hours.  If you had a scopolamine patch placed behind your ear for the management of post- operative nausea and/or vomiting:  1. The medication in the patch is effective for 72 hours, after which it should be removed.  Wrap patch in a tissue and discard in the trash. Wash hands thoroughly with soap and water. 2. You may remove the patch earlier than 72 hours if you experience unpleasant side effects which may include dry mouth, dizziness or visual disturbances. 3. Avoid touching the patch. Wash your hands with soap and water after contact with the patch.  May remove patch behind ear Friday, May 31, 2019.

## 2019-05-29 NOTE — Anesthesia Procedure Notes (Signed)
Procedure Name: LMA Insertion Date/Time: 05/29/2019 8:23 AM Performed by: Briant Sites, CRNA Pre-anesthesia Checklist: Patient identified, Emergency Drugs available, Suction available and Patient being monitored Patient Re-evaluated:Patient Re-evaluated prior to induction Oxygen Delivery Method: Circle system utilized Preoxygenation: Pre-oxygenation with 100% oxygen Induction Type: IV induction Ventilation: Mask ventilation without difficulty LMA: LMA inserted LMA Size: 3.0 Number of attempts: 1 Airway Equipment and Method: Bite block Placement Confirmation: positive ETCO2 Tube secured with: Tape Dental Injury: Teeth and Oropharynx as per pre-operative assessment

## 2019-05-29 NOTE — Op Note (Signed)
05/29/19  Surgeon: Ellison Hughs, MD Preoperative Diagnoses: AUB-IMB Postoperative Diagnoses: AUB-IMB, endometrial polyp   Procedures performed:  1) Hysteroscopy dilation and curettage 2) Myosure resection    IVF: 500cc LR EBL: 10cc UOP: 75cc clear yellow urine via red rubber  Fluid deficit: 190cc   Anesthesia: LMA  Findings: External genitalia WNL. Upon insertion of hysteroscope, uterine septum noted, thick, midline. BL cornua visualized without issue. At 8'o'clock within intrauterine cavity, small pale polypoid structure noted.    Indications: Ms Kristy Larsen is a 43yo [phone] with AUB-IMB. Office EMB shows endometriod-type polype while preoperative imaging via TVUS shows 4.1xx5.8x3.8cm retroverted uterus, trilaminar EMS, BL PCOS ovaries, 3cm pelvic cyst. Pt desires surgical management of AUB-IMB. Consented for above procedures   RBA:  The patient was informed of the risks and benefits of a hysteroscopy with dilation and curettage. Risks included but were not limited to bleeidng, infections, injury to the vulva, vagina or cerivx, or uterine perforation. If concern for latter, may proceed with diagnostic laparoscopy for evaluation of injury which may require surgical repair. Patient understands and is amenable.      Operative details: Patient was taken to operating room where general anesthesia via LMA was established. Placed in dorsal lithotomy with appropriate padded points. No antibiotics given preop via ACOG recommendations. Time out was performed after vaginal prep was carried out with Betadine. Tenaculum then placed on anterior cervical lip. Pratt dilators used (17) to dilate internal os. Gentle traction used to dilate internal os. Upon insertion of Myosure hysteroscope, findings noted as above. Myosure resection carried out until visible pathology was removed. Scope removed, D&C carried out in gentle fashion clockwise until gritty texture noted throughout. Scope withdrawn, tenaculum  removed. Bleeding controlled with pressure. All instruments removed from vagina.   Fluid deficit controlled throughout, final deficit as above  Patient tolerated procedure well. All counts correct at end of procedure

## 2019-05-29 NOTE — Transfer of Care (Signed)
Immediate Anesthesia Transfer of Care Note  Patient: Kristy Larsen  Procedure(s) Performed: DILATATION AND CURETTAGE /HYSTEROSCOPY (N/A Uterus)  Patient Location: PACU  Anesthesia Type:General  Level of Consciousness: drowsy  Airway & Oxygen Therapy: Patient Spontanous Breathing and Patient connected to nasal cannula oxygen  Post-op Assessment: Report given to RN  Post vital signs: Reviewed and stable  Last Vitals:118/71  Vitals Value Taken Time  BP    Temp    Pulse 63 05/29/19 0901  Resp 14 05/29/19 0901  SpO2 100 % 05/29/19 0901  Vitals shown include unvalidated device data.  Last Pain:  Vitals:   05/29/19 0736  TempSrc: Oral  PainSc: 0-No pain      Patients Stated Pain Goal: 2 (05/29/19 0736)  Complications: No apparent anesthesia complications

## 2019-05-29 NOTE — Anesthesia Preprocedure Evaluation (Addendum)
Anesthesia Evaluation  Patient identified by MRN, date of birth, ID band Patient awake    Reviewed: Allergy & Precautions, NPO status , Patient's Chart, lab work & pertinent test results  Airway Mallampati: I  TM Distance: >3 FB Neck ROM: Full    Dental no notable dental hx. (+) Teeth Intact, Dental Advisory Given   Pulmonary neg pulmonary ROS,    Pulmonary exam normal breath sounds clear to auscultation       Cardiovascular negative cardio ROS Normal cardiovascular exam Rhythm:Regular Rate:Normal     Neuro/Psych negative neurological ROS  negative psych ROS   GI/Hepatic Neg liver ROS, GERD  Medicated,  Endo/Other  Hypothyroidism   Renal/GU negative Renal ROS  negative genitourinary   Musculoskeletal negative musculoskeletal ROS (+)   Abdominal   Peds  Hematology negative hematology ROS (+)   Anesthesia Other Findings   Reproductive/Obstetrics                            Anesthesia Physical Anesthesia Plan  ASA: II  Anesthesia Plan: General   Post-op Pain Management:    Induction: Intravenous  PONV Risk Score and Plan: 3 and Ondansetron, Dexamethasone and Midazolam  Airway Management Planned: LMA  Additional Equipment:   Intra-op Plan:   Post-operative Plan: Extubation in OR  Informed Consent: I have reviewed the patients History and Physical, chart, labs and discussed the procedure including the risks, benefits and alternatives for the proposed anesthesia with the patient or authorized representative who has indicated his/her understanding and acceptance.     Dental advisory given  Plan Discussed with: CRNA  Anesthesia Plan Comments:         Anesthesia Quick Evaluation

## 2019-05-30 LAB — SURGICAL PATHOLOGY

## 2019-06-14 ENCOUNTER — Ambulatory Visit: Payer: 59 | Attending: Internal Medicine

## 2019-06-14 DIAGNOSIS — Z23 Encounter for immunization: Secondary | ICD-10-CM

## 2019-06-14 NOTE — Progress Notes (Signed)
   Covid-19 Vaccination Clinic  Name:  IDONNA HEEREN    MRN: [id number] DOB: [birth year]  06/14/2019  Ms. Zundel was observed post Covid-19 immunization for 15 minutes without incident. She was provided with Vaccine Information Sheet and instruction to access the V-Safe system.   Ms. Wilmott was instructed to call 911 with any severe reactions post vaccine: Marland Kitchen Difficulty breathing  . Swelling of face and throat  . A fast heartbeat  . A bad rash all over body  . Dizziness and weakness   Immunizations Administered    Name Date Dose VIS Date Route   Pfizer COVID-19 Vaccine 06/14/2019 10:28 AM 0.3 mL 03/08/2019 Intramuscular   Manufacturer: [employer], [employer]   Lot: [id number]   NDC: [address]-1

## 2019-07-10 ENCOUNTER — Ambulatory Visit: Payer: Self-pay | Attending: Internal Medicine

## 2019-07-10 DIAGNOSIS — Z23 Encounter for immunization: Secondary | ICD-10-CM

## 2019-07-10 NOTE — Progress Notes (Signed)
   Covid-19 Vaccination Clinic  Name:  AISLYNN CIFELLI    MRN: [id number] DOB: 24-Jul-1975  07/10/2019  Ms. Scoggin was observed post Covid-19 immunization for 15 minutes without incident. She was provided with Vaccine Information Sheet and instruction to access the V-Safe system.   Ms. Warriner was instructed to call 911 with any severe reactions post vaccine: Marland Kitchen Difficulty breathing  . Swelling of face and throat  . A fast heartbeat  . A bad rash all over body  . Dizziness and weakness   Immunizations Administered    Name Date Dose VIS Date Route   Pfizer COVID-19 Vaccine 07/10/2019  4:32 PM 0.3 mL 03/08/2019 Intramuscular   Manufacturer: [employer], [employer]   Lot: [id number]   NDC: [address]-7

## 2020-04-06 IMAGING — MG MM BREAST BX W LOC DEV 1ST LESION IMAGE BX SPEC STEREO GUIDE*L*
8 of 10 series · 8 of 14 positions shown · non-contrast
Comparison: Previous exams.
COMPARISON: Previous exams.

Addendum:
CLINICAL DATA: Patient presents for stereotactic core needle biopsy
of a developing asymmetry in the upper outer left breast.

EXAM:
LEFT BREAST STEREOTACTIC CORE NEEDLE BIOPSY

[L (1 of 8)]
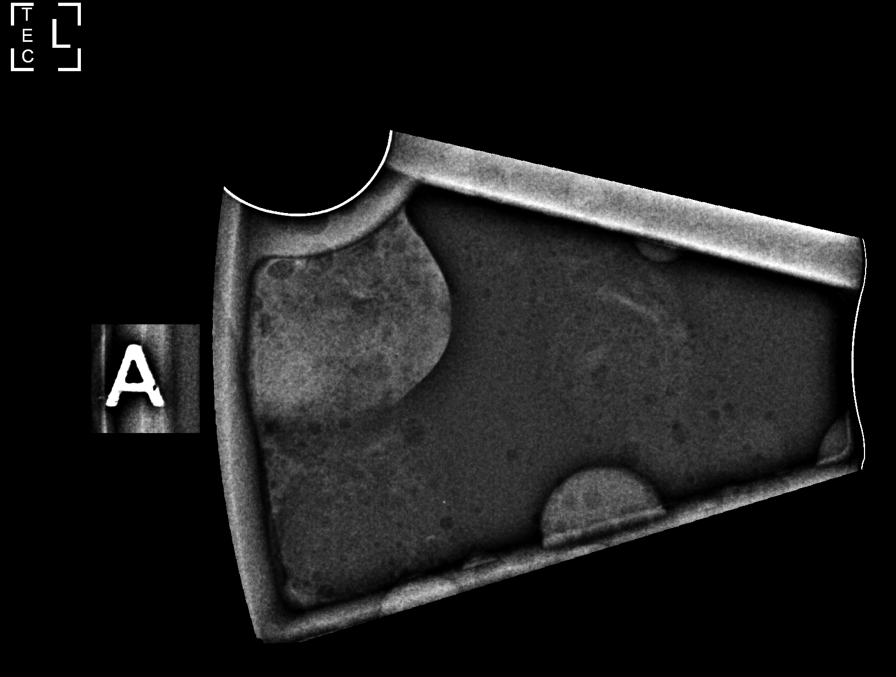

[L (2 of 8)]
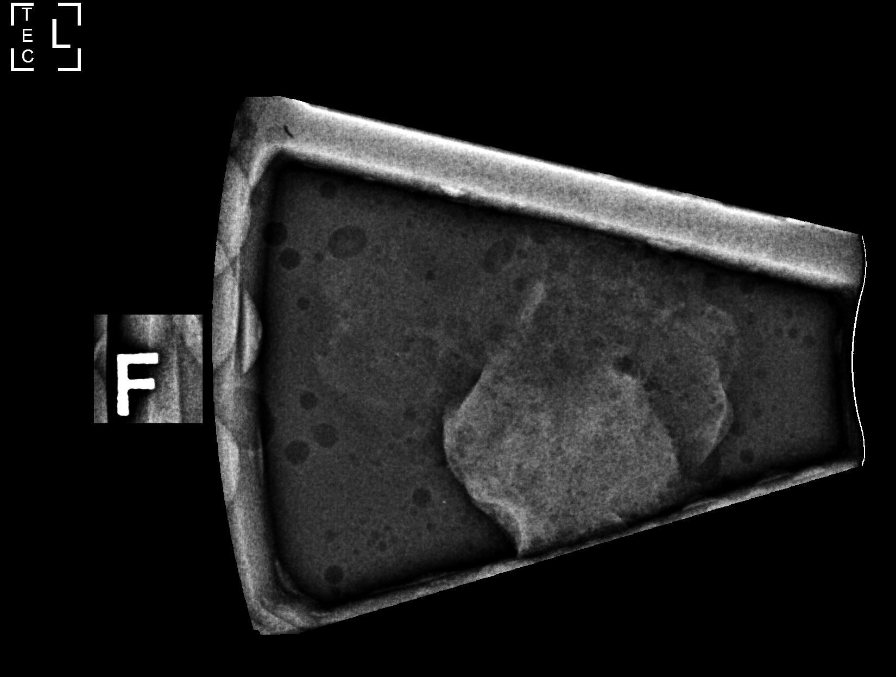

[L (3 of 8)]
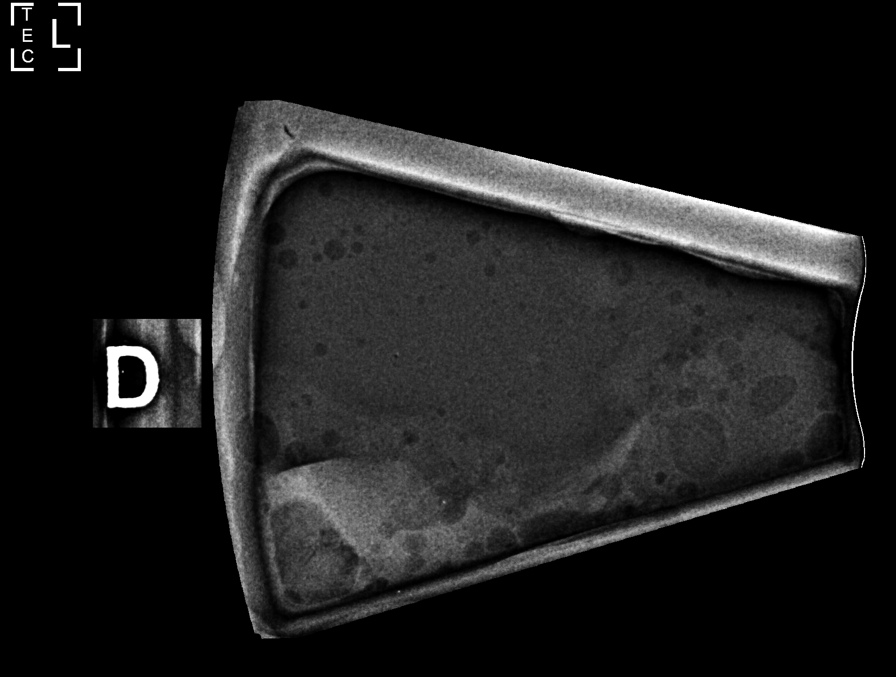

[L (4 of 8)]
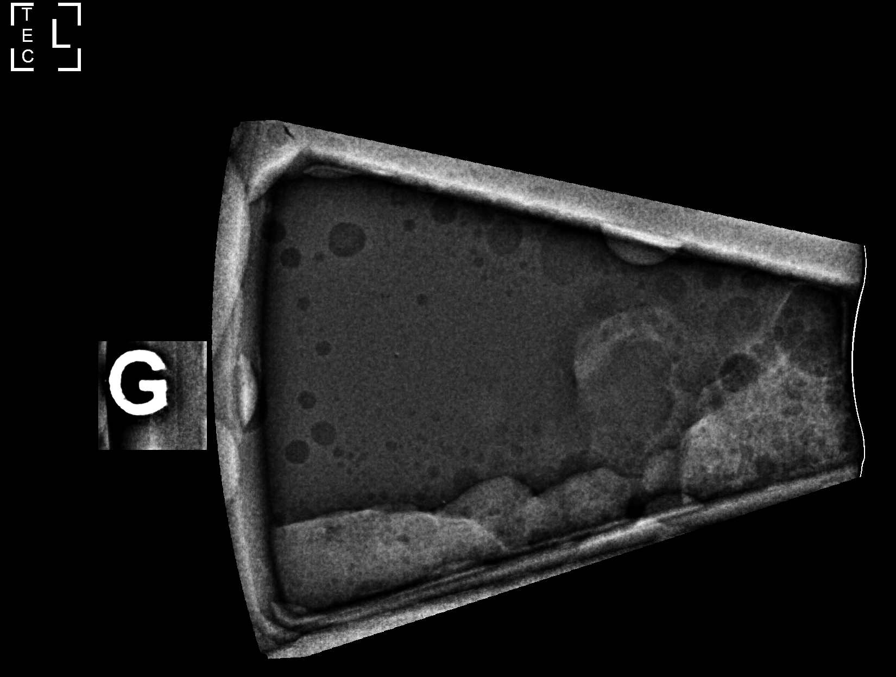

[L (5 of 8)]
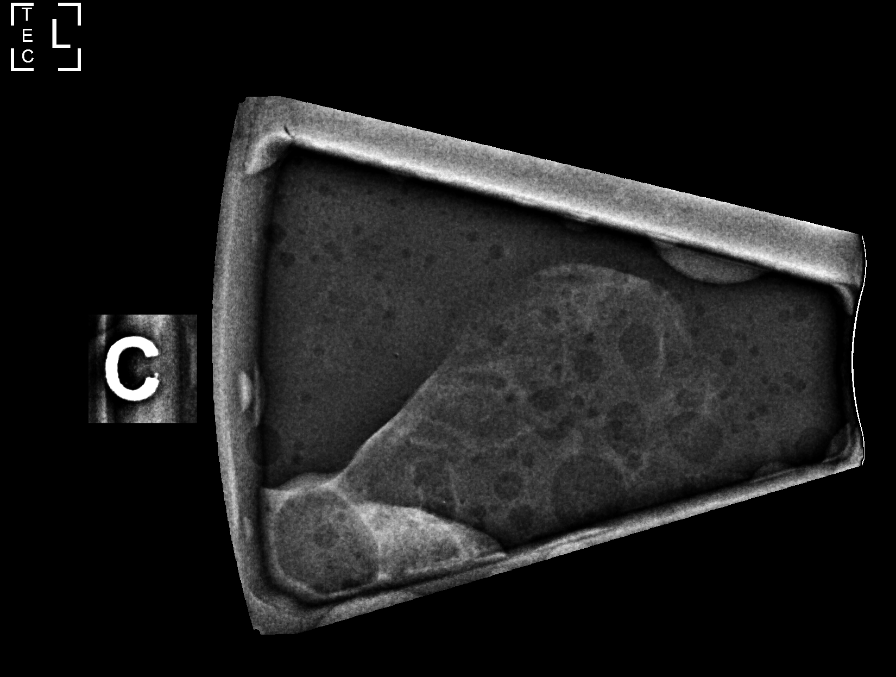

[L (6 of 8)]
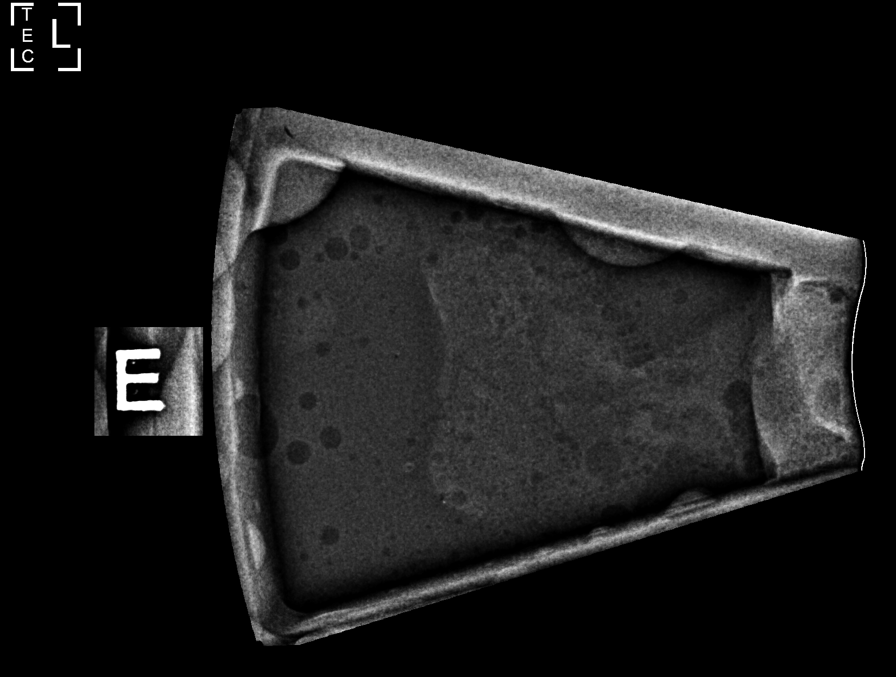

[L (7 of 8)]
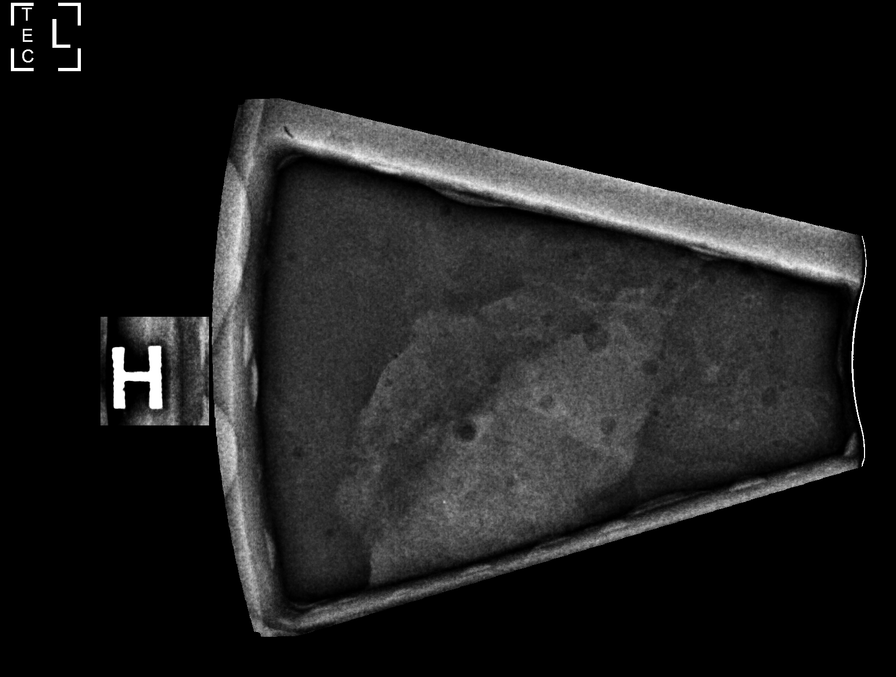

[L (8 of 8)]
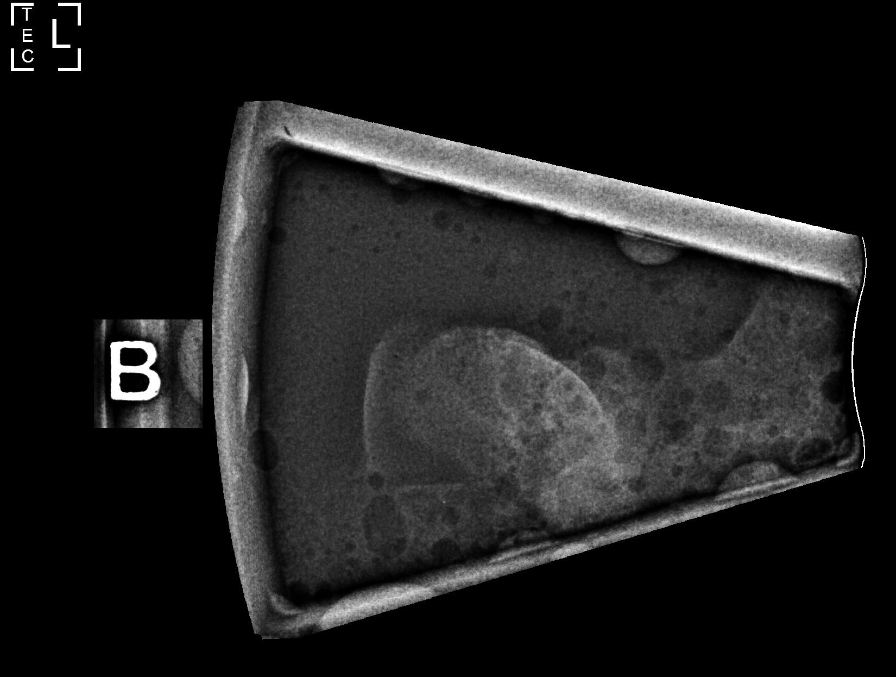

[8 of 14 positions shown; findings below may reference images not displayed]



Using sterile technique and 1% Lidocaine as local anesthetic, under
stereotactic guidance, a 9 gauge vacuum assisted device was used to
perform core needle biopsy of the area of asymmetry in the upper
outer left breast using a superior approach.

Lesion quadrant: Upper outer quadrant

At the conclusion of the procedure, coil shaped tissue marker clip
was deployed into the biopsy cavity. Follow-up 2-view mammogram was
performed and dictated separately.
IMPRESSION: Stereotactic-guided biopsy of the left breast. No apparent
complications.

ADDENDUM:
Pathology revealed PSEUDOANGIOMATOUS STROMAL HYPERPLASIA of the LEFT
breast, upper, outer quadrant. This was found to be concordant by
Dr. Elvis Jhon Av.

Pathology results were discussed with the patient by telephone. The
patient reported doing well after the biopsy with tenderness at the
site. Post biopsy instructions and care were reviewed and questions
were answered. The patient was encouraged to call The [REDACTED]

The patient was instructed to return for annual screening
mammography at [HOSPITAL] [facility]HOSPITAL].

Pathology results reported by Lb Huh, RN on 03/18/2019.



Using sterile technique and 1% Lidocaine as local anesthetic, under
stereotactic guidance, a 9 gauge vacuum assisted device was used to
perform core needle biopsy of the area of asymmetry in the upper
outer left breast using a superior approach.

Lesion quadrant: Upper outer quadrant

At the conclusion of the procedure, coil shaped tissue marker clip
was deployed into the biopsy cavity. Follow-up 2-view mammogram was
performed and dictated separately.
IMPRESSION: Stereotactic-guided biopsy of the left breast. No apparent
complications.

## 2020-12-15 ENCOUNTER — Other Ambulatory Visit: Payer: Self-pay

## 2020-12-15 ENCOUNTER — Emergency Department (HOSPITAL_BASED_OUTPATIENT_CLINIC_OR_DEPARTMENT_OTHER): Payer: Managed Care, Other (non HMO)

## 2020-12-15 ENCOUNTER — Emergency Department (HOSPITAL_BASED_OUTPATIENT_CLINIC_OR_DEPARTMENT_OTHER)
Admission: EM | Admit: 2020-12-15 | Discharge: 2020-12-15 | Disposition: A | Discharge status: Home / Self Care | Payer: Managed Care, Other (non HMO) | Attending: Emergency Medicine | Admitting: Emergency Medicine

## 2020-12-15 ENCOUNTER — Encounter (HOSPITAL_BASED_OUTPATIENT_CLINIC_OR_DEPARTMENT_OTHER): Payer: Self-pay | Admitting: *Deleted

## 2020-12-15 DIAGNOSIS — R1013 Epigastric pain: Secondary | ICD-10-CM | POA: Diagnosis not present

## 2020-12-15 DIAGNOSIS — M25511 Pain in right shoulder: Secondary | ICD-10-CM | POA: Diagnosis not present

## 2020-12-15 DIAGNOSIS — Z79899 Other long term (current) drug therapy: Secondary | ICD-10-CM | POA: Diagnosis not present

## 2020-12-15 DIAGNOSIS — R1011 Right upper quadrant pain: Secondary | ICD-10-CM | POA: Diagnosis not present

## 2020-12-15 DIAGNOSIS — E039 Hypothyroidism, unspecified: Secondary | ICD-10-CM | POA: Diagnosis not present

## 2020-12-15 LAB — CBC
HCT: 40.4 % (ref 36.0–46.0)
Hemoglobin: 13.2 g/dL (ref 12.0–15.0)
MCH: 29.6 pg (ref 26.0–34.0)
MCHC: 32.7 g/dL (ref 30.0–36.0)
MCV: 90.6 fL (ref 80.0–100.0)
Platelets: 174 10*3/uL (ref 150–400)
RBC: 4.46 MIL/uL (ref 3.87–5.11)
RDW: 16.6 % — ABNORMAL HIGH (ref 11.5–15.5)
WBC: 9.4 10*3/uL (ref 4.0–10.5)
nRBC: 0 % (ref 0.0–0.2)

## 2020-12-15 LAB — TROPONIN I (HIGH SENSITIVITY)
Troponin I (High Sensitivity): 4 ng/L (ref <18)
Troponin I (High Sensitivity): 3 ng/L (ref <18)

## 2020-12-15 LAB — BASIC METABOLIC PANEL
Anion gap: 7 (ref 5–15)
BUN: 10 mg/dL (ref 6–20)
CO2: 25 mmol/L (ref 22–32)
Calcium: 9.3 mg/dL (ref 8.9–10.3)
Chloride: 105 mmol/L (ref 98–111)
Creatinine, Ser: 0.83 mg/dL (ref 0.44–1.00)
GFR, Estimated: >60 mL/min (ref >60)
Glucose, Bld: 94 mg/dL (ref 70–99)
Potassium: 4.2 mmol/L (ref 3.5–5.1)
Sodium: 137 mmol/L (ref 135–145)

## 2020-12-15 MED ORDER — LIDOCAINE VISCOUS HCL 2 % MT SOLN
15.0000 mL | Freq: Once | OROMUCOSAL | Status: AC
  Administered 2020-12-15: 15 mL via ORAL
  Filled 2020-12-15: qty 15

## 2020-12-15 MED ORDER — ALUM & MAG HYDROXIDE-SIMETH 200-200-20 MG/5ML PO SUSP
30.0000 mL | Freq: Once | ORAL | Status: AC
  Administered 2020-12-15: 30 mL via ORAL
  Filled 2020-12-15: qty 30

## 2020-12-15 MED ORDER — SUCRALFATE 1 G PO TABS: 1.0000 g | ORAL_TABLET | Freq: Three times a day (TID) | ORAL | 0 refills | Status: AC

## 2020-12-15 NOTE — ED Provider Notes (Signed)
[facility] EMERGENCY DEPARTMENT Provider Note   CSN: [id number] Arrival date & time: 12/15/20  1417     History Chief Complaint  Patient presents with   Chest Pain    Kristy Larsen is a 45 y.o. female who presents emergency department chief complaint of epigastric abdominal pain.  Patient has a past history of GERD and does not normally take medications.  She states that 2 weeks ago she went to a wedding and had some alcohol and since that time is has persistent epigastric reflux symptoms.  She also complains of pain on the right side of her upper abdomen radiating to her right shoulder and sharp pain in her chest at times.  She has sensation of fullness behind her sternum.  She denies any vomiting or nausea.  She has been taking omeprazole and Tagamet without relief of her symptoms.  She denies fevers, chills, shortness of breath, vomiting.   Chest Pain     Past Medical History:  Diagnosis Date   Abnormal uterine bleeding (AUB)    GERD (gastroesophageal reflux disease)    History of gestational diabetes    Hypothyroid    followed by pcp   Vitamin D deficiency    Wears glasses     Patient Active Problem List   Diagnosis Date Noted   Preterm labor 07/04/2015   Presentation of cord 07/04/2015   Postpartum care following cesarean delivery 07/04/2015    Past Surgical History:  Procedure Laterality Date   CESAREAN SECTION N/A 07/04/2015   Procedure: CESAREAN SECTION;  Surgeon: Edwinna Areola, DO;  Location: [facility];  Service: Obstetrics;  Laterality: N/A;   HYSTEROSCOPY WITH D & C N/A 05/29/2019   Procedure: DILATATION AND CURETTAGE /HYSTEROSCOPY;  Surgeon: Carlisle Cater, MD;  Location: [facility];  Service: Gynecology;  Laterality: N/A;   PILONIDAL CYST EXCISION  age 48   WISDOM TOOTH EXTRACTION  [age]     OB History     Gravida  2   Para  2   Term      Preterm  2   AB      Living  2      SAB      IAB      Ectopic       Multiple  0   Live Births  2           No family history on file.  Social History   Tobacco Use   Smoking status: Never   Smokeless tobacco: Never  Vaping Use   Vaping Use: Never used  Substance Use Topics   Alcohol use: Yes    Comment: occasional   Drug use: Never    Home Medications Prior to Admission medications   Medication Sig Start Date End Date Taking? Authorizing Provider  Cholecalciferol (VITAMIN D3) 50 MCG (2000 UT) TABS Take 1 tablet by mouth daily.   Yes [provider]  cimetidine (TAGAMET) 200 MG tablet Take 200 mg by mouth as needed.   Yes [provider]  ibuprofen (ADVIL) 800 MG tablet Take 1 tablet (800 mg total) by mouth every 8 (eight) hours as needed. 05/29/19  Yes Shivaji, Valerie Roys, MD  levothyroxine (SYNTHROID) 100 MCG tablet Take 100 mcg by mouth daily before breakfast.   Yes [provider]  nabumetone (RELAFEN) 500 MG tablet Take 1,000 mg by mouth as needed for moderate pain.   Yes [provider]  Probiotic CAPS Take 1 capsule by  mouth daily.   Yes [provider]  Ferrous Sulfate (IRON PO) Take by mouth as needed (takes during menstral cycle). OTC    [provider]  Melatonin 5 MG TABS Take 5 mg by mouth as needed.    [provider]  Methionine (ME-500) 500 MG CAPS Take by mouth.    [provider]  oxyCODONE (OXY IR/ROXICODONE) 5 MG immediate release tablet Take 1 tablet (5 mg total) by mouth every 6 (six) hours as needed for severe pain. 05/29/19   Shivaji, Valerie Roys, MD    Allergies    Aspirin, Codeine, Hydrocodone, and Sudafed [pseudoephedrine hcl]  Review of Systems   Review of Systems  Cardiovascular:  Positive for chest pain.  Ten systems reviewed and are negative for acute change, except as noted in the HPI.   Physical Exam Updated Vital Signs BP (!) 141/83 (BP Location: Right Arm)   Pulse 82   Temp 98.7 F (37.1 C) (Oral)   Resp 12   Ht 5' (1.524 m)   Wt  68.9 kg   SpO2 100%   BMI 29.69 kg/m   Physical Exam Vitals and nursing note reviewed.  Constitutional:      General: She is not in acute distress.    Appearance: She is well-developed. She is not diaphoretic.  HENT:     Head: Normocephalic and atraumatic.     Right Ear: External ear normal.     Left Ear: External ear normal.     Nose: Nose normal.     Mouth/Throat:     Mouth: Mucous membranes are moist.  Eyes:     General: No scleral icterus.    Conjunctiva/sclera: Conjunctivae normal.  Cardiovascular:     Rate and Rhythm: Normal rate and regular rhythm.     Heart sounds: Normal heart sounds. No murmur heard.   No friction rub. No gallop.  Pulmonary:     Effort: Pulmonary effort is normal. No respiratory distress.     Breath sounds: Normal breath sounds.  Abdominal:     General: Bowel sounds are normal. There is no distension.     Palpations: Abdomen is soft. There is no mass.     Tenderness: There is abdominal tenderness in the epigastric area. There is no guarding.  Musculoskeletal:     Cervical back: Normal range of motion.  Skin:    General: Skin is warm and dry.  Neurological:     Mental Status: She is alert and oriented to person, place, and time.  Psychiatric:        Behavior: Behavior normal.    ED Results / Procedures / Treatments   Labs (all labs ordered are listed, but only abnormal results are displayed) Labs Reviewed  CBC - Abnormal; Notable for the following components:      Result Value   RDW 16.6 (*)    All other components within normal limits  BASIC METABOLIC PANEL  PREGNANCY, URINE  TROPONIN I (HIGH SENSITIVITY)  TROPONIN I (HIGH SENSITIVITY)    EKG None  Radiology DG Chest 2 View  Result Date: 12/15/2020 CLINICAL DATA:  Chest pain EXAM: CHEST - 2 VIEW COMPARISON:  12/12/2020 FINDINGS: The heart size and mediastinal contours are within normal limits. No focal pulmonary opacity. No pleural effusion or pneumothorax. No acute osseous  abnormality. The imaged abdomen is unremarkable. IMPRESSION: No active cardiopulmonary disease. Electronically Signed   By: Wiliam Ke M.D.   On: 12/15/2020 19:24   US Abdomen Limited RUQ (LIVER/GB)  Result Date: 12/15/2020 CLINICAL DATA:  Epigastric abdominal pain. EXAM: ULTRASOUND ABDOMEN LIMITED RIGHT UPPER QUADRANT COMPARISON:  None. FINDINGS: Gallbladder: No gallstones or wall thickening visualized. No sonographic Murphy sign noted by sonographer. Common bile duct: Diameter: 3.3 mm Liver: No focal lesion identified. Within normal limits in parenchymal echogenicity. Portal vein is patent on color Doppler imaging with normal direction of blood flow towards the liver. Other: None. IMPRESSION: 1. Normal right upper quadrant ultrasound. Electronically Signed   By: Darliss Cheney M.D.   On: 12/15/2020 20:14    Procedures Procedures   Medications Ordered in ED Medications  alum & mag hydroxide-simeth (MAALOX/MYLANTA) 200-200-20 MG/5ML suspension 30 mL (30 mLs Oral Given 12/15/20 2024)    And  lidocaine (XYLOCAINE) 2 % viscous mouth solution 15 mL (15 mLs Oral Given 12/15/20 2025)    ED Course  I have reviewed the triage vital signs and the nursing notes.  Pertinent labs & imaging results that were available during my care of the patient were reviewed by me and considered in my medical decision making (see chart for details).    MDM Rules/Calculators/A&P                           Patient here with epigastric abdominal pain and chest pain.  I ordered and reviewed labs that included CBC without abnormality, BMP and troponin x2 without abnormality. Reviewed and interpreted the EKG which shows normal sinus rhythm at a rate of 81 without ischemic abnormalities.  I ordered and reviewed imaging which included an ultrasound of the right upper quadrant which shows no abnormalities as well as 2 view chest x-ray again without abnormality. I suspect that the patient's symptoms are primarily GI.  She had  some improvement of her pain with GI cocktail.  Patient has a GI specialist with whom she can follow-up.  Given the large differential diagnosis for PATT STEINHARDT, the decision making in this case is of high complexity.  After evaluating all of the data points in this case, the presentation of CHANIAH CISSE is NOT consistent with Acute Coronary Syndrome (ACS) and/or myocardial ischemia, pulmonary embolism, aortic dissection; Borhaave's, significant arrythmia, pneumothorax, cardiac tamponade, or other emergent cardiopulmonary condition.  Further, the presentation of YARELIE HAMS is NOT consistent with pericarditis, myocarditis, cholecystitis, pancreatitis, mediastinitis, endocarditis, new valvular disease.  Additionally, the presentation of Dystany Duffy Norrisis NOT consistent with flail chest, cardiac contusion, ARDS, or significant intra-thoracic or intra-abdominal bleeding.  Moreover, this presentation is NOT consistent with pneumonia, sepsis, or pyelonephritis.    Strict return and follow-up precautions have been given by me personally or by detailed written instruction given verbally by nursing staff using the teach back method to the patient/family/caregiver(s).  Data Reviewed/Counseling: I have reviewed the patient's vital signs, nursing notes, and other relevant tests/information. I had a detailed discussion regarding the historical points, exam findings, and any diagnostic results supporting the discharge diagnosis. I also discussed the need for outpatient follow-up and the need to return to the ED if symptoms worsen or if there are any questions or concerns that arise at home.   Final Clinical Impression(s) / ED Diagnoses Final diagnoses:  Epigastric abdominal pain    Rx / DC Orders ED Discharge Orders     None        Arthor Captain, PA-C 12/15/20 2343    Charlynne Pander, MD 12/17/20 1400

## 2020-12-15 NOTE — ED Triage Notes (Signed)
2 weeks of GERD. No relief with Omeprazole and Tagamet. Stabbing pain in the center of her chest into her right arm and RUQ.

## 2020-12-15 NOTE — Discharge Instructions (Addendum)
Your work-up was negative here today for any cause of your symptoms including concern for acute coronary syndrome.  Your ultrasound did not show any evidence of gallbladder wall inflammation or gallbladder stones and your labs were all within normal limits. Please continue taking your medications as directed.  I am discharging you with an add-on medication called Carafate which may help coat the stomach lining.  Please follow with your GI specialist and your PCP as soon as possible.  Abdominal (belly) pain can be caused by many things. Your caregiver performed an examination and possibly ordered blood/urine tests and imaging (CT scan, x-rays, ultrasound). Many cases can be observed and treated at home after initial evaluation in the emergency department. Even though you are being discharged home, abdominal pain can be unpredictable. Therefore, you need a repeated exam if your pain does not resolve, returns, or worsens. Most patients with abdominal pain don't have to be admitted to the hospital or have surgery, but serious problems like appendicitis and gallbladder attacks can start out as nonspecific pain. Many abdominal conditions cannot be diagnosed in one visit, so follow-up evaluations are very important. SEEK IMMEDIATE MEDICAL ATTENTION IF: The pain does not go away or becomes severe.  A temperature above 101 develops.  Repeated vomiting occurs (multiple episodes).  The pain becomes localized to portions of the abdomen. The right side could possibly be appendicitis. In an adult, the left lower portion of the abdomen could be colitis or diverticulitis.  Blood is being passed in stools or vomit (bright red or black tarry stools).  Return also if you develop chest pain, difficulty breathing, dizziness or fainting, or become confused, poorly responsive, or inconsolable (young children).

## 2022-01-07 IMAGING — US US ABDOMEN LIMITED
1 series · 14 of 25 positions shown · non-contrast
Comparison: None.

CLINICAL DATA: Epigastric abdominal pain.

EXAM:
ULTRASOUND ABDOMEN LIMITED RIGHT UPPER QUADRANT

[Series 1: us abdomen limited · 14 of 58 slices shown]
[im 1/58]
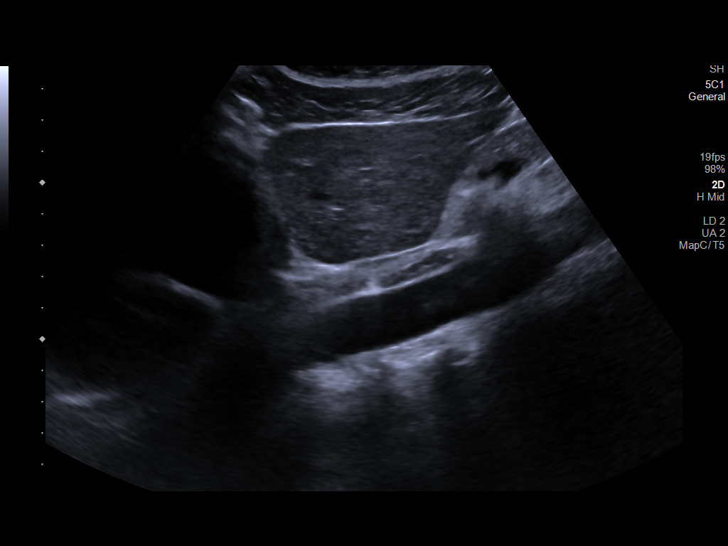
[im 5/58]
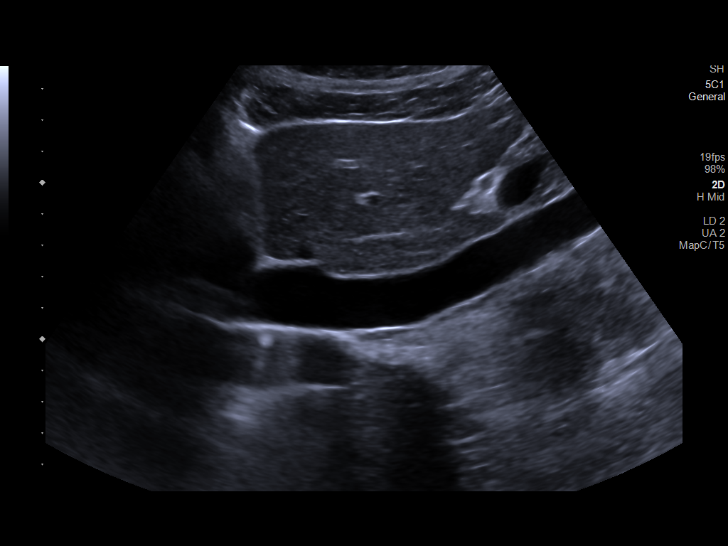
[im 10/58]
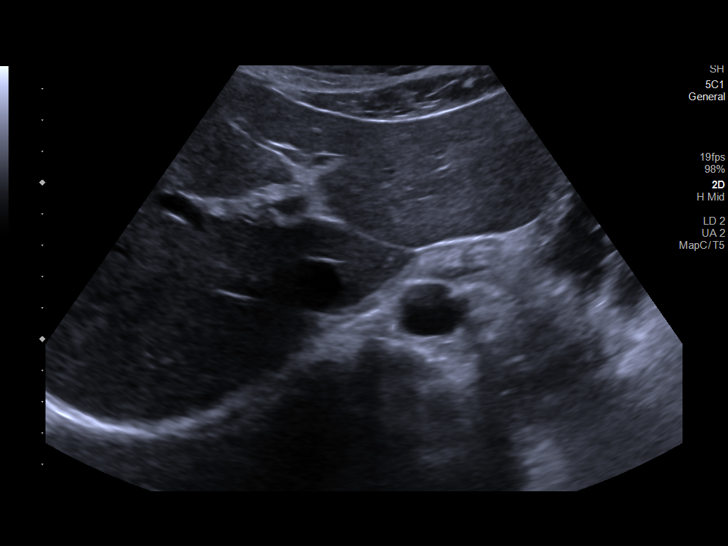
[im 15/58]
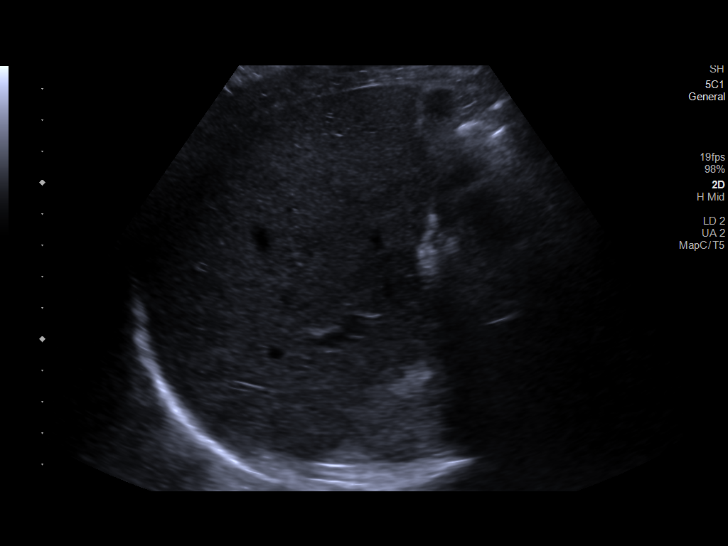
[im 20/58]
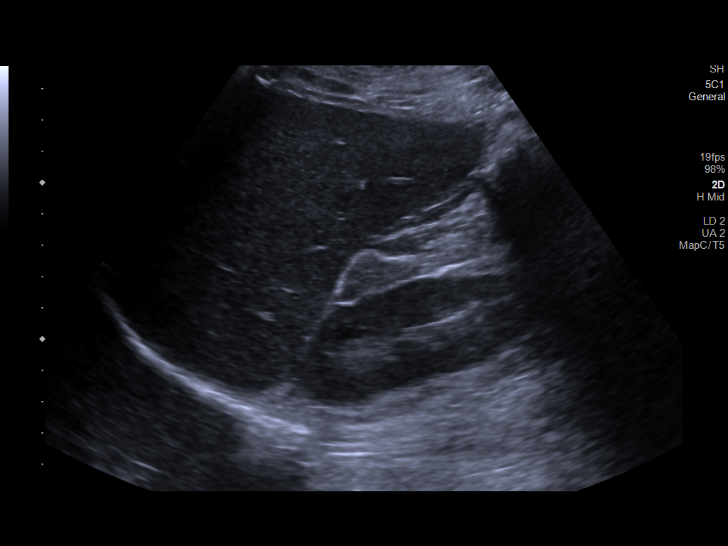
[im 22/58]
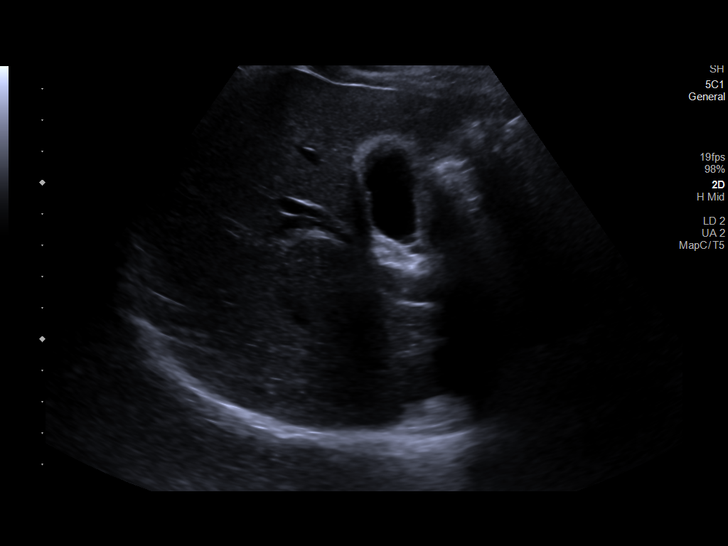
[im 27/58]
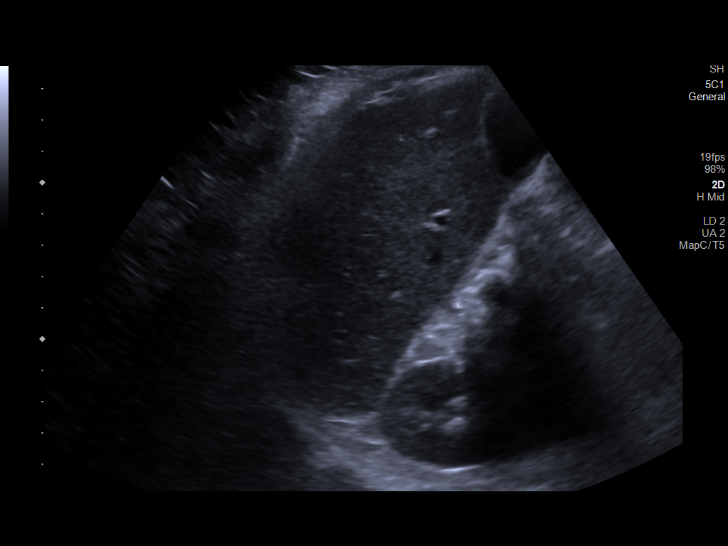
[im 31/58]
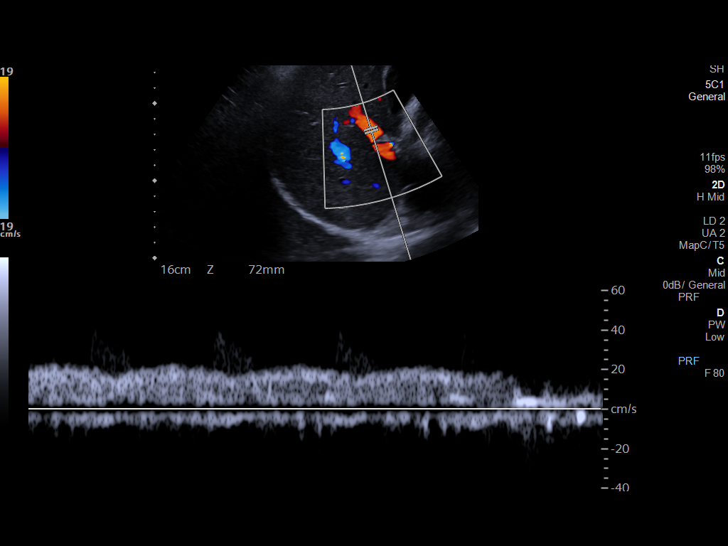
[im 36/58]
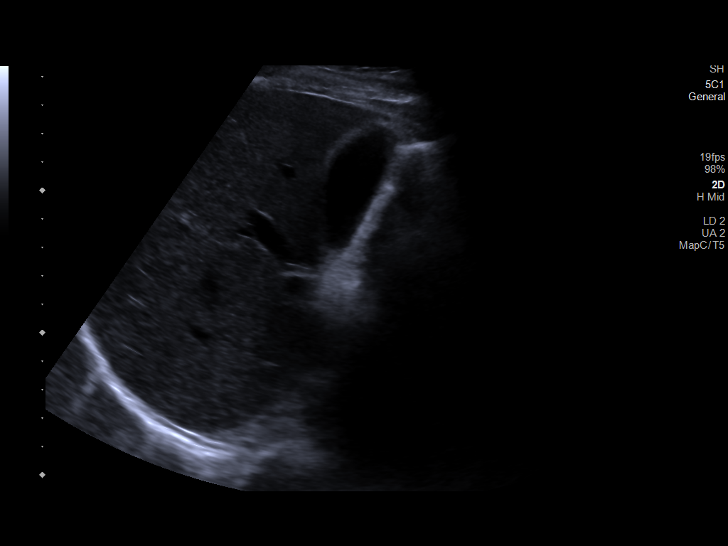
[im 39/58]
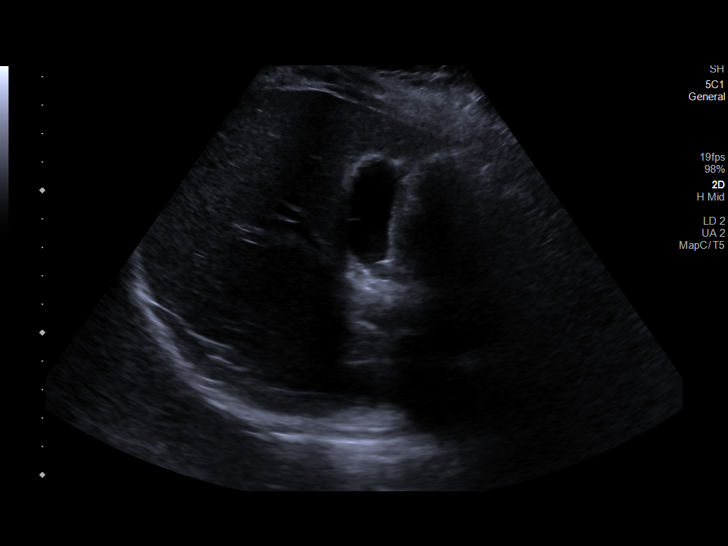
[im 43/58]
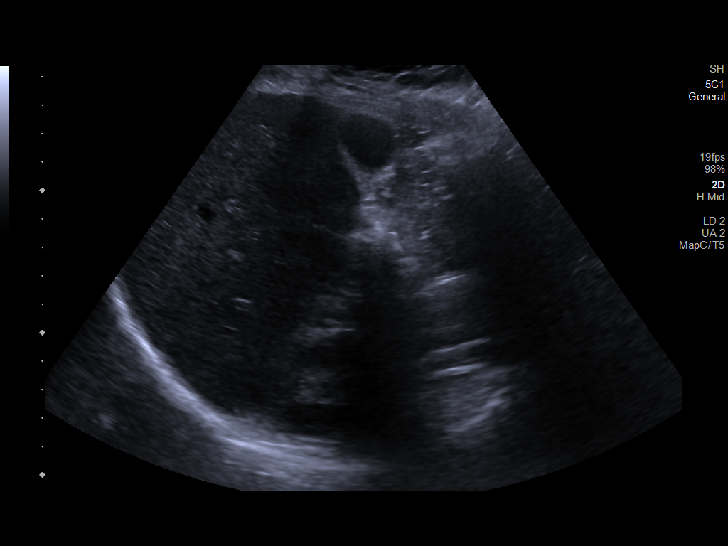
[im 48/58]
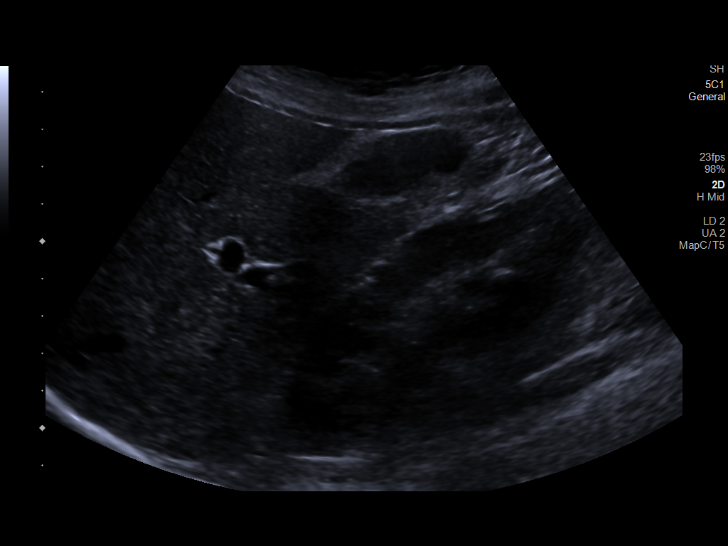
[im 53/58]
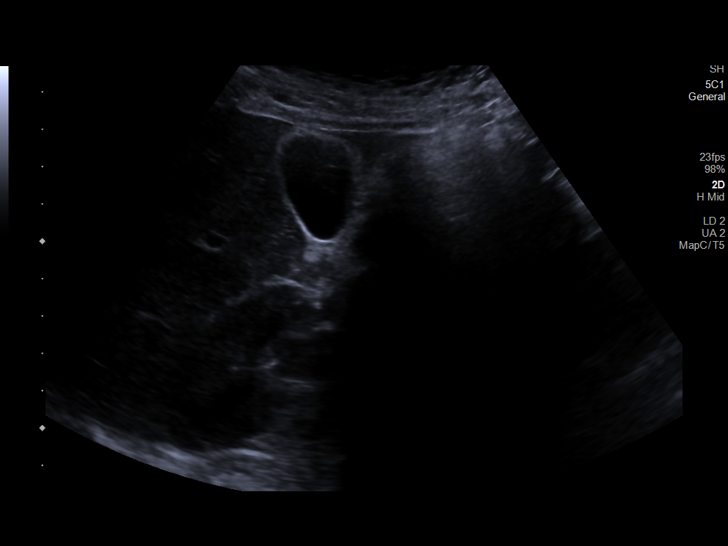
[im 58/58]
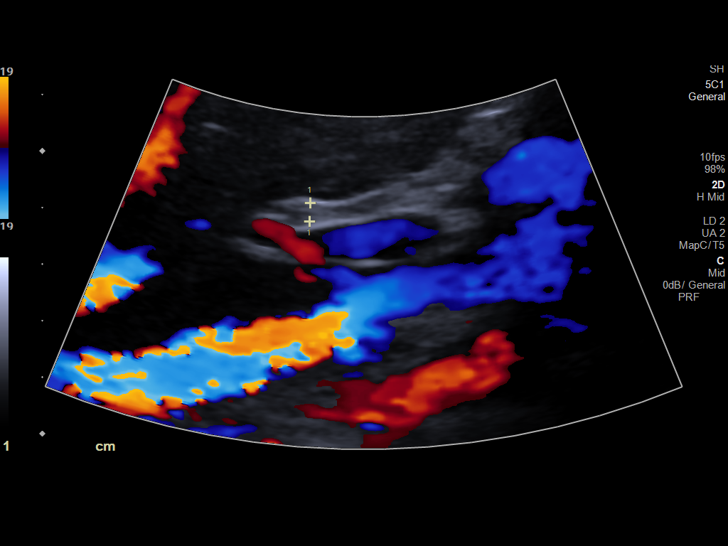

[14 of 25 positions shown; findings below may reference images not displayed]

FINDINGS: Gallbladder:

No gallstones or wall thickening visualized. No sonographic Murphy
sign noted by sonographer.

Common bile duct:

Diameter: 3.3 mm

Liver:

No focal lesion identified. Within normal limits in parenchymal
echogenicity. Portal vein is patent on color Doppler imaging with
normal direction of blood flow towards the liver.

Other: None.
IMPRESSION: 1. Normal right upper quadrant ultrasound.

## 2023-11-06 ENCOUNTER — Other Ambulatory Visit: Payer: Self-pay | Admitting: Medical Genetics

## 2024-02-02 ENCOUNTER — Other Ambulatory Visit (HOSPITAL_COMMUNITY): Payer: Self-pay

## 2024-02-06 ENCOUNTER — Other Ambulatory Visit: Payer: Self-pay | Admitting: Medical Genetics

## 2024-02-06 DIAGNOSIS — Z006 Encounter for examination for normal comparison and control in clinical research program: Secondary | ICD-10-CM
